# Patient Record
Sex: Male | Born: 1945 | Race: White | Hispanic: No | Smoking: Current every day smoker
Health system: Southern US, Community
[De-identification: ages and names within clinical notes are randomized; demographics above are authoritative.]

## PROBLEM LIST (undated history)

## (undated) DIAGNOSIS — K089 Disorder of teeth and supporting structures, unspecified: Secondary | ICD-10-CM

## (undated) DIAGNOSIS — Z972 Presence of dental prosthetic device (complete) (partial): Secondary | ICD-10-CM

## (undated) DIAGNOSIS — R350 Frequency of micturition: Secondary | ICD-10-CM

## (undated) DIAGNOSIS — J302 Other seasonal allergic rhinitis: Secondary | ICD-10-CM

## (undated) DIAGNOSIS — R339 Retention of urine, unspecified: Secondary | ICD-10-CM

## (undated) DIAGNOSIS — C61 Malignant neoplasm of prostate: Secondary | ICD-10-CM

## (undated) DIAGNOSIS — Z87442 Personal history of urinary calculi: Secondary | ICD-10-CM

## (undated) DIAGNOSIS — M1990 Unspecified osteoarthritis, unspecified site: Secondary | ICD-10-CM

## (undated) HISTORY — PX: PROSTATE BIOPSY: SHX241

## (undated) HISTORY — PX: OTHER SURGICAL HISTORY: SHX169

---

## 2017-09-10 DIAGNOSIS — C61 Malignant neoplasm of prostate: Secondary | ICD-10-CM

## 2017-09-10 HISTORY — PX: PROSTATE BIOPSY: SHX241

## 2017-09-10 HISTORY — DX: Malignant neoplasm of prostate: C61

## 2017-09-10 HISTORY — PX: HERNIA REPAIR: SHX51

## 2017-09-20 ENCOUNTER — Other Ambulatory Visit: Payer: Self-pay | Admitting: Urology

## 2017-09-20 DIAGNOSIS — C61 Malignant neoplasm of prostate: Secondary | ICD-10-CM

## 2017-10-07 ENCOUNTER — Encounter (HOSPITAL_COMMUNITY)
Admission: RE | Admit: 2017-10-07 | Discharge: 2017-10-07 | Disposition: A | Discharge status: Home / Self Care | Payer: Medicare Other | Source: Ambulatory Visit | Attending: Urology | Admitting: Urology

## 2017-10-07 ENCOUNTER — Ambulatory Visit (HOSPITAL_COMMUNITY)
Admission: RE | Admit: 2017-10-07 | Discharge: 2017-10-07 | Disposition: A | Discharge status: Home / Self Care | Payer: Medicare Other | Source: Ambulatory Visit | Attending: Urology | Admitting: Urology

## 2017-10-07 DIAGNOSIS — R937 Abnormal findings on diagnostic imaging of other parts of musculoskeletal system: Secondary | ICD-10-CM | POA: Diagnosis not present

## 2017-10-07 DIAGNOSIS — C61 Malignant neoplasm of prostate: Secondary | ICD-10-CM | POA: Insufficient documentation

## 2017-10-07 MED ORDER — TECHNETIUM TC 99M MEDRONATE IV KIT
22.0000 | PACK | Freq: Once | INTRAVENOUS | Status: AC | PRN
  Administered 2017-10-07: 22 via INTRAVENOUS

## 2017-10-11 ENCOUNTER — Other Ambulatory Visit: Payer: Self-pay | Admitting: Urology

## 2017-10-11 ENCOUNTER — Encounter: Payer: Self-pay | Admitting: Radiation Oncology

## 2017-10-11 DIAGNOSIS — C61 Malignant neoplasm of prostate: Secondary | ICD-10-CM

## 2017-10-17 ENCOUNTER — Ambulatory Visit (HOSPITAL_COMMUNITY)
Admission: RE | Admit: 2017-10-17 | Discharge: 2017-10-17 | Disposition: A | Discharge status: Home / Self Care | Payer: Medicare Other | Source: Ambulatory Visit | Attending: Urology | Admitting: Urology

## 2017-10-17 DIAGNOSIS — M48061 Spinal stenosis, lumbar region without neurogenic claudication: Secondary | ICD-10-CM | POA: Diagnosis not present

## 2017-10-17 DIAGNOSIS — C61 Malignant neoplasm of prostate: Secondary | ICD-10-CM

## 2017-10-17 DIAGNOSIS — M47814 Spondylosis without myelopathy or radiculopathy, thoracic region: Secondary | ICD-10-CM | POA: Insufficient documentation

## 2017-10-17 DIAGNOSIS — M47816 Spondylosis without myelopathy or radiculopathy, lumbar region: Secondary | ICD-10-CM | POA: Diagnosis not present

## 2017-10-17 MED ORDER — GADOBENATE DIMEGLUMINE 529 MG/ML IV SOLN
15.0000 mL | Freq: Once | INTRAVENOUS | Status: AC | PRN
  Administered 2017-10-17: 14 mL via INTRAVENOUS

## 2017-10-18 LAB — POCT I-STAT CREATININE: CREATININE: 0.8 mg/dL (ref 0.61–1.24)

## 2017-10-30 ENCOUNTER — Ambulatory Visit: Payer: Medicare Other

## 2017-10-30 ENCOUNTER — Ambulatory Visit: Payer: Medicare Other | Admitting: Radiation Oncology

## 2017-10-31 ENCOUNTER — Other Ambulatory Visit: Payer: Self-pay

## 2017-10-31 ENCOUNTER — Encounter: Payer: Self-pay | Admitting: Medical Oncology

## 2017-10-31 ENCOUNTER — Ambulatory Visit
Admission: RE | Admit: 2017-10-31 | Discharge: 2017-10-31 | Disposition: A | Discharge status: Home / Self Care | Payer: Medicare Other | Source: Ambulatory Visit | Attending: Radiation Oncology | Admitting: Radiation Oncology

## 2017-10-31 ENCOUNTER — Encounter: Payer: Self-pay | Admitting: Radiation Oncology

## 2017-10-31 DIAGNOSIS — Z800 Family history of malignant neoplasm of digestive organs: Secondary | ICD-10-CM | POA: Diagnosis not present

## 2017-10-31 DIAGNOSIS — N281 Cyst of kidney, acquired: Secondary | ICD-10-CM | POA: Diagnosis not present

## 2017-10-31 DIAGNOSIS — F17210 Nicotine dependence, cigarettes, uncomplicated: Secondary | ICD-10-CM | POA: Insufficient documentation

## 2017-10-31 DIAGNOSIS — M47894 Other spondylosis, thoracic region: Secondary | ICD-10-CM | POA: Insufficient documentation

## 2017-10-31 DIAGNOSIS — C61 Malignant neoplasm of prostate: Secondary | ICD-10-CM

## 2017-10-31 DIAGNOSIS — Z79899 Other long term (current) drug therapy: Secondary | ICD-10-CM | POA: Insufficient documentation

## 2017-10-31 DIAGNOSIS — Z8042 Family history of malignant neoplasm of prostate: Secondary | ICD-10-CM | POA: Insufficient documentation

## 2017-10-31 DIAGNOSIS — M5126 Other intervertebral disc displacement, lumbar region: Secondary | ICD-10-CM | POA: Diagnosis not present

## 2017-10-31 HISTORY — DX: Malignant neoplasm of prostate: C61

## 2017-10-31 NOTE — Progress Notes (Signed)
Introduced myself to Mr. Peter Arnold and his family as the prostate nurse navigator and my role. He states he has been in excellent health but had issues with urgency, nocturia and feeling like he was not emptying his bladder. He was prescribed Flomax and symptoms have greatly improved. He had an elevated PSA, underwent a biopsy and found out he has cancer. He received Mills Koller 10/11/17 and is scheduled for Lupron 11/12/17.  He states he had fatigue a few days post injection but it has improved. We discussed the side effects of ADT.  He is here to discuss his radiation options and would like to get treatment in [place] if possible. I informed him that Dr. Tammi Klippel works closely with Dr. Orlene Erm  in [place] so this should not be a problem. I gave him my business card and asked him to call if I can assist him in any way. He voiced understanding.

## 2017-10-31 NOTE — Progress Notes (Addendum)
GU Location of Tumor / Histology: prostatic adenocarcinoma  If Prostate Cancer, Gleason Score is (4 + 4) and PSA is (242). Prostate volume: 79 cm^3  Peter Arnold went to his PCP, Julieta Bellini, in December for his flu and pneumonia shot. He told Dr. Gloriann Loan about the urgency, hesitancy, and nocturia he was having. Dr. Gloriann Loan prescribed him Flomax and referred him to Dr. Comer Locket. He was diagnosed with prostate cancer 08/31/2017 by Dr. Comer Locket. He underwent a prostate biopsy on 08/31/2017. Then, patient phoned [facility] and obtained an appointment for a second opinion.   Biopsies of prostate (if applicable) revealed:    Past/Anticipated interventions by urology, if any: prostate biopsy, flomax, CT scan (pelvic lymphadenopathy and possible left lateral wall bladder tumor), Bone scan (two areas in thoracic spine), MRI of lumbar and thoracic spine (no mets), Firmagon 240 on 10/11/2017, scheduled to return in March 5th for Lupron, referral to radiation oncology  Past/Anticipated interventions by medical oncology, if any: no  Weight changes, if any: no  Bowel/Bladder complaints, if any: reports significant improvement in his urgency, hesitancy, weak stream and nocturia since starting Flomax. Denies dysuria or hematuria. Reports post void dribble has greatly improved since using Flomax as well.   Nausea/Vomiting, if any: no  Pain issues, if any:  no  SAFETY ISSUES:  Prior radiation? no  Pacemaker/ICD? no  Possible current pregnancy? no  Is the patient on methotrexate? no  Current Complaints / other details:  72 year old male. Divorced. Current smoker. Mechanic/farm equipment. Esophageal cancer - Father. Prostate cancer - Grandfather.  Requesting treatment in [place].  Accompanied today by friend, sister and niece.

## 2017-10-31 NOTE — Progress Notes (Signed)
See progress note under physician encounter. 

## 2017-10-31 NOTE — Progress Notes (Signed)
Radiation Oncology         [phone]) [phone] ________________________________  Initial Outpatient Consultation  Name: Peter Arnold MRN: [id number]  Date: 10/31/2017  DOB: May 16, 1946  VF:IEPP, Gwyndolyn Saxon, MD  McKenzie, Candee Furbish, MD   REFERRING PHYSICIAN: Alyson Ingles Candee Furbish, MD  DIAGNOSIS: 72 y.o. 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 Malignant neoplasm of prostate [facility]) [place] Ambulatory referral to Radiation Oncology    HISTORY OF PRESENT ILLNESS: Peter Arnold is a 72 y.o. male with a diagnosis of locally advanced prostate cancer. He was noted to have an elevated PSA of 242.80 in December 2018 by his primary care physician, Dr. Elisabeth Cara.  Accordingly, he was referred for evaluation in urology to Dr. Comer Locket and proceeded to transrectal ultrasound with 12 biopsies of the prostate on 08/31/2017.  The prostate volume measured 79 cc.  Out of 12 core biopsies, all 12 were positive.  The maximum Gleason score was 4+4, and this was seen in the left lateral mid, left lateral base, left lateral apex, right mid, right apex, left mid, right lateral base, right lateral apex, right lateral mid, and right base. Additionally there was Gleason 4+3 disease in the left base and 3+3 in the left apex.  Biopsies of prostate revealed:   The patient sought a second opinion with Dr. Alyson Ingles at [facility] on 09/20/2017 who started the workup for his high-risk prostate cancer including CT and bone scans, and he was started on Flomax for BPH with LUTS. He notes significant improvement in his urgency, hesitancy, weak stream, and nocturia with the Flomax. His CT scan on 10/07/2017 showed a 1.7 cm left iliac node, a 1.4 cm left obturator node and a 1.1 cm left inferior gluteal node, suspicious for metastatic prostate cancer.  Additionally, there was a 1.0 cm right external iliac node and asymetric enhancement of the left seminal vesicle.   The prostate gland was noted to indent the bladder base with a 2.6 x 1.0 cm enhancing mass along the left bladder base near the UVJ.  This was evaluated further with office cytoscopy on 10/10/2017 revealing an enlarged median lobe and severe hyperplasia with the prostate tumor extending into the left base of the bladder but no evidence of bladder tumor/involvement.  A Bone scan on 10/07/17 showed abnormal uptake in the cervical spine, mid thoracic spine at T8 and lumbar spine at L2 of unknown significance.  Further evaluation with MRI was recommended.  He received an injection of Firmagon 240mg  on 10/10/2017 and is scheduled to return to Dr. Alyson Ingles on March 5th for Lupron. An MRI of the thoracic and lumbar spine on 10/17/2017 showed degenerative changes only and was negative for metastatic disease.   The patient reviewed the biopsy results with his urologist and he has kindly been referred today for discussion of potential radiation treatment options. He is accompanied today by his sister, niece, and friend.   PREVIOUS RADIATION THERAPY: No  PAST MEDICAL HISTORY:  Past Medical History:  Diagnosis Date  . Prostate cancer ([place])       PAST SURGICAL HISTORY: Past Surgical History:  Procedure Laterality Date  . PROSTATE BIOPSY      FAMILY HISTORY:  Family History  Problem Relation Age of Onset  . Cancer Father        esophageal  . Cancer Paternal Grandfather        prostate    SOCIAL HISTORY:  Social History   Socioeconomic History  . Marital status: Divorced    Spouse name: Not on file  . Number of children: Not on file  . Years of education: Not on file  . Highest education level: Not on file  Social Needs  . Financial resource strain: Not on file  . Food insecurity - worry: Not on file  . Food insecurity - inability: Not on file  . Transportation needs - medical: Not on file  . Transportation needs - non-medical: Not on file  Occupational History  . Occupation: Dealer     Comment: works on farm equippment  Tobacco Use  . Smoking status: Current Every Day Smoker    Packs/day: 1.00    Years: 30.00    Pack years: 30.00    Types: Cigarettes  . Smokeless tobacco: Never Used  Substance and Sexual Activity  . Alcohol use: No    Frequency: Never  . Drug use: No  . Sexual activity: Yes  Other Topics Concern  . Not on file  Social History Narrative   Resides in [place], [place].    ALLERGIES: Patient has no known allergies.  MEDICATIONS:  Current Outpatient Medications  Medication Sig Dispense Refill  . fluticasone (FLONASE) 50 MCG/ACT nasal spray     . Multiple Vitamin (MULTIVITAMIN) tablet Take 1 tablet by mouth daily.    . tamsulosin (FLOMAX) 0.4 MG CAPS capsule     . finasteride (PROSCAR) 5 MG tablet      No current facility-administered medications for this encounter.     REVIEW OF SYSTEMS:  On review of systems, the patient reports that he is doing well overall. He denies any chest pain, shortness of breath, cough, fevers, chills, night sweats, or unintended weight changes. He denies any bowel disturbances, and denies abdominal pain, nausea or vomiting. He denies any new musculoskeletal or joint aches or pains. His IPSS was 14, indicating moderate urinary symptoms. He reports significant improvement in his urgency, hesitancy, weak stream, nocturia, and post-void dribble since starting Flomax. He denies any dysuria or hematuria. He reports mild-moderate erectile dysfunction. A complete review of systems is obtained and is otherwise negative.    PHYSICAL EXAM:  Wt Readings from Last 3 Encounters:  10/31/17 153 lb (69.4 kg)   Temp Readings from Last 3 Encounters:  10/31/17 98.2 F (36.8 C) (Oral)   BP Readings from Last 3 Encounters:  10/31/17 124/77   Pulse Readings from Last 3 Encounters:  10/31/17 83   Pain Assessment Pain Score: 0-No pain/10  In general this is a well appearing Caucasian man in no acute distress. He is alert and oriented  x4 and appropriate throughout the examination. HEENT reveals that the patient is normocephalic, atraumatic. Skin is intact without any evidence of gross lesions. Cardiovascular exam reveals a regular rate and rhythm, no clicks rubs or murmurs are auscultated. Chest is clear to auscultation bilaterally. Lymphatic assessment is performed and does not reveal any adenopathy in the supraclavicular or axillary chains. Abdomen has active bowel sounds in all quadrants and is intact. The abdomen is soft, non tender, non distended. Lower extremities are negative for pretibial pitting edema, deep calf tenderness, cyanosis or clubbing.   KPS = 100  100 - Normal; no complaints; no evidence of disease. 90   - Able to carry on normal activity; minor signs or symptoms of disease. 80   - Normal activity with effort; some signs or symptoms of disease. 66   - Cares for self; unable to  carry on normal activity or to do active work. 60   - Requires occasional assistance, but is able to care for most of his personal needs. 50   - Requires considerable assistance and frequent medical care. 22   - Disabled; requires special care and assistance. 32   - Severely disabled; hospital admission is indicated although death not imminent. 69   - Very sick; hospital admission necessary; active supportive treatment necessary. 10   - Moribund; fatal processes progressing rapidly. 0     - Dead  Karnofsky DA, Abelmann WH, Craver LS and Burchenal JH [phone]) The use of the nitrogen mustards in the palliative treatment of carcinoma: with particular reference to bronchogenic carcinoma Cancer 1 634-56  LABORATORY DATA:  No results found for: WBC, HGB, HCT, MCV, PLT No results found for: NA, K, CL, CO2 No results found for: ALT, AST, GGT, ALKPHOS, BILITOT   RADIOGRAPHY: Mr Thoracic Spine W Wo Contrast  Result Date: 10/17/2017 CLINICAL DATA:  History of prostate cancer. Abnormal uptake on recent bone scan could be due to metastatic  disease or degenerative change. EXAM: MRI THORACIC AND LUMBAR SPINE WITHOUT AND WITH CONTRAST TECHNIQUE: Multiplanar and multiecho pulse sequences of the thoracic and lumbar spine were obtained without and with intravenous contrast. CONTRAST:  14 ml MULTIHANCE GADOBENATE DIMEGLUMINE 529 MG/ML IV SOLN COMPARISON:  MRI lumbar spine 07/20/2014. Whole-body bone scan 10/07/2017. CT abdomen and pelvis 10/07/2017. FINDINGS: MRI THORACIC SPINE FINDINGS Alignment:  Maintained. Vertebrae: No fracture. No marrow signal abnormality to suggest metastatic disease or other neoplastic process. Cord:  Normal signal throughout. Paraspinal and other soft tissues: Negative. Disc levels: C6-7 is partially imaged. There is a large disc osteophyte complex eccentric to the right and uncovertebral spurring. The right foramen is markedly narrowed. T3-4: Minimal disc bulge without stenosis. T5-6: Small left paracentral protrusion without stenosis. T6-7: Shallow disc bulge and facet degenerative disease without stenosis. T7-8: Shallow left paracentral protrusion and facet arthropathy without stenosis. T8-9: Minimal disc bulge and facet degenerative disease without stenosis. T11-12: Facet arthropathy, minimal disc bulge and ligamentum flavum thickening without stenosis. T12-L1: Very shallow disc bulge without stenosis. Except as described, intervertebral disc spaces are unremarkable. MRI LUMBAR SPINE FINDINGS Segmentation:  Standard. Alignment:  0.2 cm degenerative retrolisthesis L2 on L3 is noted. Vertebrae: Negative for metastatic disease. Scattered degenerative endplate signal change is most notable at L1-2. Conus medullaris: Extends to the L1 level and appears normal. Paraspinal and other soft tissues: Renal cysts are noted. Disc levels: L1-2: There has been progression of degenerative disc disease at this level. Broad-based disc bulge is more prominent and there is new protruding disc in the left subarticular recess and foramen. Disc could  impact the descending left L2 root. There is moderate left foraminal narrowing. The right foramen is open. L2-3: Broad-based disc bulge and facet degenerative disease are stable in appearance. Mild narrowing in the subarticular recesses is unchanged. The foramina are open. L3-4: There is a disc bulge and superimposed right foraminal protrusion. The central canal is open. Mild to moderate foraminal narrowing is worse on the right. The appearance is unchanged. L4-5: Very shallow left paracentral protrusion superimposed on a mild bulge is unchanged. Loss of disc space height is again seen. The central canal is open. Mild to moderate bilateral foraminal narrowing is unchanged. L5-S1: Negative. IMPRESSION: Negative for metastatic disease in the thoracic or lumbar spine. Uptake on bone scan was due to degenerative change. Mild thoracic spondylosis without central canal or foraminal narrowing. Spondylosis at  L1-2 has worsened since the prior MRI and causes mild narrowing in the left subarticular recess which could impact the descending left L2 root. Moderate left foraminal narrowing is also present at this level. The appearance of the lumbar spine is otherwise unchanged. Electronically Signed   By: Inge Rise M.D.   On: 10/17/2017 12:14   Mr Lumbar Spine W Wo Contrast  Result Date: 10/17/2017 CLINICAL DATA:  History of prostate cancer. Abnormal uptake on recent bone scan could be due to metastatic disease or degenerative change. EXAM: MRI THORACIC AND LUMBAR SPINE WITHOUT AND WITH CONTRAST TECHNIQUE: Multiplanar and multiecho pulse sequences of the thoracic and lumbar spine were obtained without and with intravenous contrast. CONTRAST:  14 ml MULTIHANCE GADOBENATE DIMEGLUMINE 529 MG/ML IV SOLN COMPARISON:  MRI lumbar spine 07/20/2014. Whole-body bone scan 10/07/2017. CT abdomen and pelvis 10/07/2017. FINDINGS: MRI THORACIC SPINE FINDINGS Alignment:  Maintained. Vertebrae: No fracture. No marrow signal abnormality  to suggest metastatic disease or other neoplastic process. Cord:  Normal signal throughout. Paraspinal and other soft tissues: Negative. Disc levels: C6-7 is partially imaged. There is a large disc osteophyte complex eccentric to the right and uncovertebral spurring. The right foramen is markedly narrowed. T3-4: Minimal disc bulge without stenosis. T5-6: Small left paracentral protrusion without stenosis. T6-7: Shallow disc bulge and facet degenerative disease without stenosis. T7-8: Shallow left paracentral protrusion and facet arthropathy without stenosis. T8-9: Minimal disc bulge and facet degenerative disease without stenosis. T11-12: Facet arthropathy, minimal disc bulge and ligamentum flavum thickening without stenosis. T12-L1: Very shallow disc bulge without stenosis. Except as described, intervertebral disc spaces are unremarkable. MRI LUMBAR SPINE FINDINGS Segmentation:  Standard. Alignment:  0.2 cm degenerative retrolisthesis L2 on L3 is noted. Vertebrae: Negative for metastatic disease. Scattered degenerative endplate signal change is most notable at L1-2. Conus medullaris: Extends to the L1 level and appears normal. Paraspinal and other soft tissues: Renal cysts are noted. Disc levels: L1-2: There has been progression of degenerative disc disease at this level. Broad-based disc bulge is more prominent and there is new protruding disc in the left subarticular recess and foramen. Disc could impact the descending left L2 root. There is moderate left foraminal narrowing. The right foramen is open. L2-3: Broad-based disc bulge and facet degenerative disease are stable in appearance. Mild narrowing in the subarticular recesses is unchanged. The foramina are open. L3-4: There is a disc bulge and superimposed right foraminal protrusion. The central canal is open. Mild to moderate foraminal narrowing is worse on the right. The appearance is unchanged. L4-5: Very shallow left paracentral protrusion superimposed on  a mild bulge is unchanged. Loss of disc space height is again seen. The central canal is open. Mild to moderate bilateral foraminal narrowing is unchanged. L5-S1: Negative. IMPRESSION: Negative for metastatic disease in the thoracic or lumbar spine. Uptake on bone scan was due to degenerative change. Mild thoracic spondylosis without central canal or foraminal narrowing. Spondylosis at L1-2 has worsened since the prior MRI and causes mild narrowing in the left subarticular recess which could impact the descending left L2 root. Moderate left foraminal narrowing is also present at this level. The appearance of the lumbar spine is otherwise unchanged. Electronically Signed   By: Inge Rise M.D.   On: 10/17/2017 12:14   Nm Bone Scan Whole Body  Result Date: 10/07/2017 CLINICAL DATA:  Prostate cancer, PSA = 242.8 EXAM: NUCLEAR MEDICINE WHOLE BODY BONE SCAN TECHNIQUE: Whole body anterior and posterior images were obtained approximately 3 hours after intravenous  injection of radiopharmaceutical. RADIOPHARMACEUTICALS:  22 mCi Technetium-26m MDP IV COMPARISON:  None Correlation: CT abdomen and pelvis 10/07/2017 FINDINGS: Multiple foci of abnormal osseous accumulation are identified including 3 foci laterally within the cervical spine, lumbar spine on LEFT at L2, and in the midthoracic spine at T8. The thoracic and lumbar foci could be degenerative or metastatic in origin. The foci of uptake in the cervical spine favor degenerative disease. Recent CT demonstrates multilevel degenerative disc disease changes of the lumbar spine without obvious sclerotic L2 metastasis. Uptake at the shoulders is likely degenerative. Uptake at the LEFT mandible and maxilla is likely related to dental disease. No other worrisome sites of abnormal osseous tracer accumulation are identified. Expected urinary tract and soft tissue distribution of tracer. IMPRESSION: Foci of abnormal increased tracer localization in the cervical spine,  midthoracic spine at T8, and lumbar spine at L2. Cannot exclude metastatic lesions at the RIGHT T8 or LEFT L2 sites of uptake on bone scan; consider MR to further assess. Electronically Signed   By: Lavonia Dana M.D.   On: 10/07/2017 17:36      IMPRESSION/PLAN: 1. 72 y.o. 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 We discussed the patient's workup and outlined the nature of prostate cancer in this setting. The patient's T stage, Gleason's score, and PSA put him into the high-risk risk group and recent imaging studies reveal locally advanced disease with pelvic lymph node involvement. Accordingly, he is eligible for treatment with 8 weeks of external radiation in combination with LT-ADT. We discussed the available radiation techniques, and focused on the details and logistics and delivery. The recommendation is for 8 weeks of daily radiotherapy treatments to the prostate and pelvic lymph nodes to begin after 2 months of ADT.  He was started on ADT 10/10/17.  We discussed and outlined the risks, benefits, short and long-term effects associated with radiotherapy and compared and contrasted these with prostatectomy. We also detailed the role of ADT in the treatment of high-risk prostate cancer and outlined the associated side effects that could be expected with this therapy.  At the end of the conversation the patient is interested in moving forward with radiation therapy in combination with LT-ADT. He has received a Firmagon injection on 10/10/2017 and is scheduled to receive his first Lupron injection on March 5th. He would like to pursue radiation therapy in [place], where he lives. We will contact Dr. Orlene Erm to make a referral for the patient to have his treatment closer to home.   We spent more than 50% of today's visit face to face with the patient in counseling and/or coordination of care.   Nicholos Johns, PA-C    Tyler Pita, MD    [facility] Direct Dial: [phone]  Fax: [phone] [email or web address]  Skype  [email or web address]  This document serves as a record of services personally performed by Tyler Pita, MD and Freeman Caldron, PA-C. It was created on their behalf by Rae Lips, a trained medical scribe. The creation of this record is based on the scribe's personal observations and the providers' statements to them. This document has been checked and approved by the attending providers.

## 2017-11-07 ENCOUNTER — Telehealth: Payer: Self-pay | Admitting: *Deleted

## 2017-11-07 NOTE — Telephone Encounter (Signed)
CALLED PATIENT TO INFORM OF APPT. WITH  DR. PALERMO ON 11-13-17 @ 9::30 AM, LVM FOR A RETURN CALL

## 2019-02-01 IMAGING — MR MR LUMBAR SPINE WO/W CM
6 of 16 series · 17 of 48 positions shown · IV contrast (multihance)
Comparison: MRI lumbar spine 07/20/2014. Whole-body bone scan
10/07/2017. CT abdomen and pelvis 10/07/2017.

CLINICAL DATA: History of prostate cancer. Abnormal uptake on
recent bone scan could be due to metastatic disease or degenerative
change.

EXAM:
MRI THORACIC AND LUMBAR SPINE WITHOUT AND WITH CONTRAST
TECHNIQUE: Multiplanar and multiecho pulse sequences of the thoracic and lumbar
spine were obtained without and with intravenous contrast.
CONTRAST:  14 ml MULTIHANCE GADOBENATE DIMEGLUMINE 529 MG/ML IV SOLN

[Series 5: T1 · sagittal · 3.0mm · 0.66mm/px · 1 of 15 slices shown (1 of 2)]
[im 1/15]
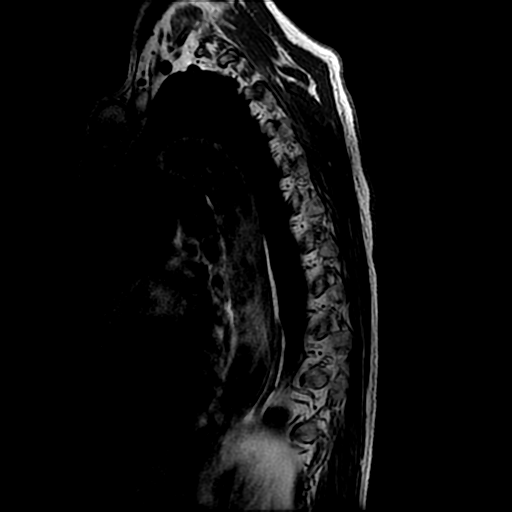

[Series 7: T2 · sagittal · 3.0mm · 0.66mm/px · 1 of 15 slices shown (1 of 3)]
[im 1/15]
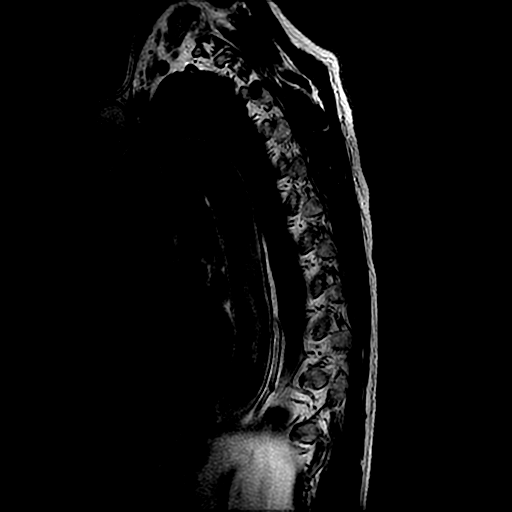

[Series 8: T2 · axial · 4.0mm · 0.39mm/px · z∈[-310,-58]mm · 4 of 40 slices shown (2 of 3)]
[im 1/40]
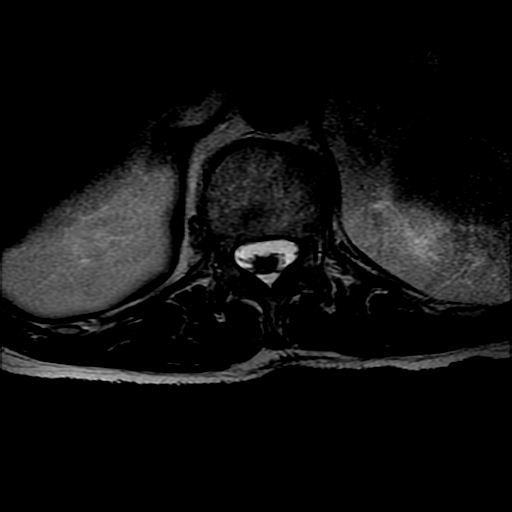
[im 14/40]
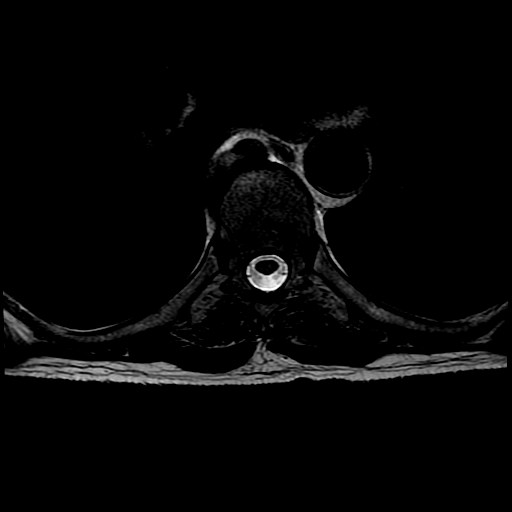
[im 27/40]
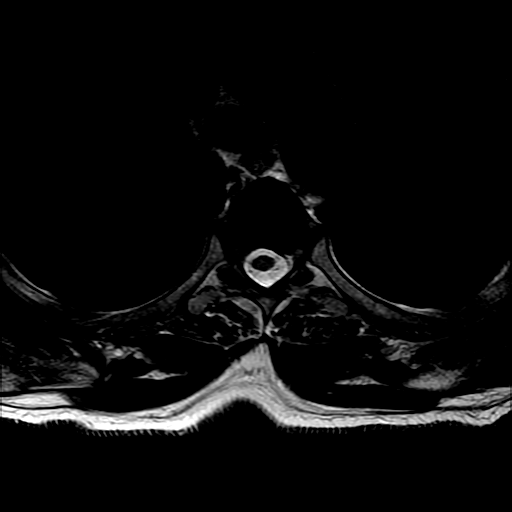
[im 40/40]
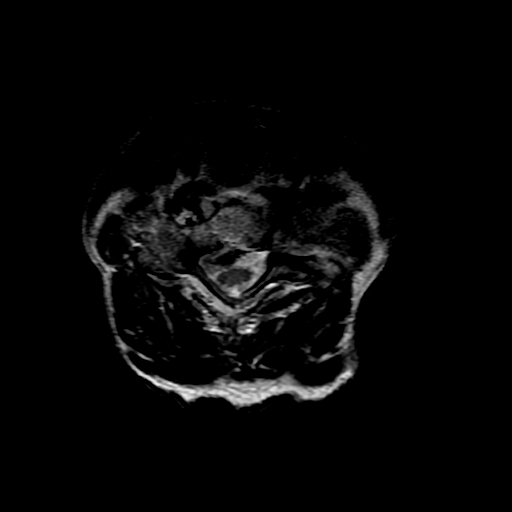

[Series 10: T1 · axial · non-contrast · 4.0mm · 0.39mm/px · z∈[-310,-122]mm · 4 of 44 slices shown (2 of 2)]
[im 1/44]
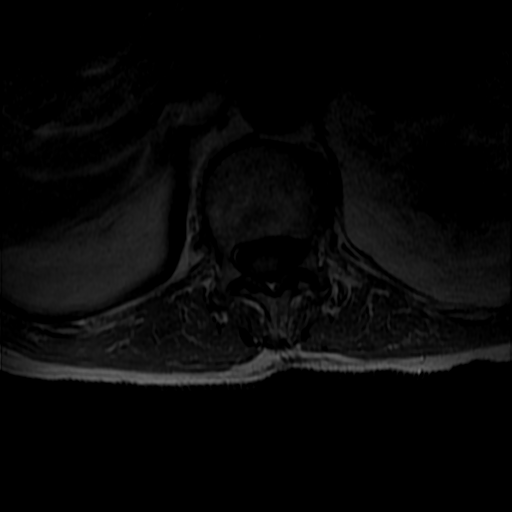
[im 11/44]
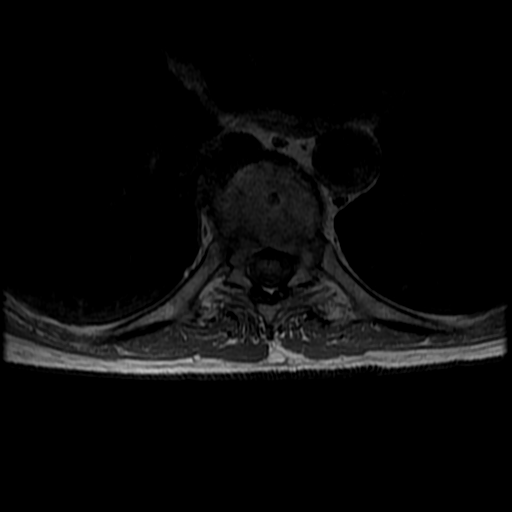
[im 22/44]
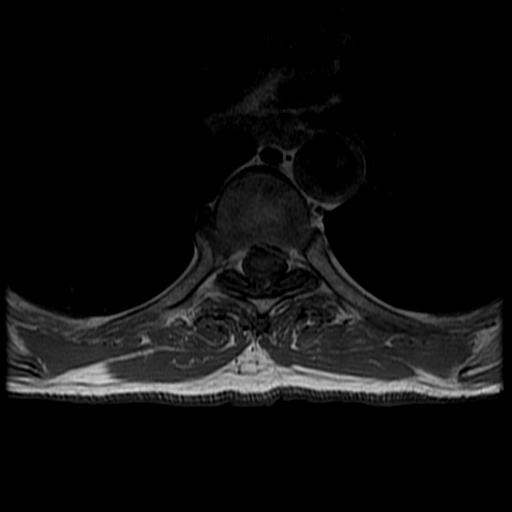
[im 33/44]
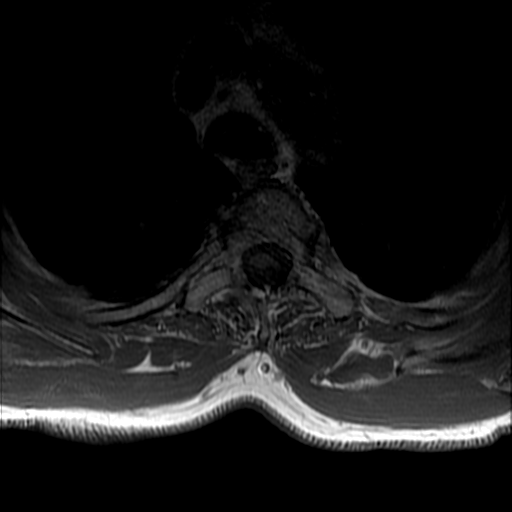

[Series 15: T2 · axial · 4.0mm · 0.39mm/px · z∈[-541,-320]mm · 5 of 40 slices shown (3 of 3)]
[im 1/40]
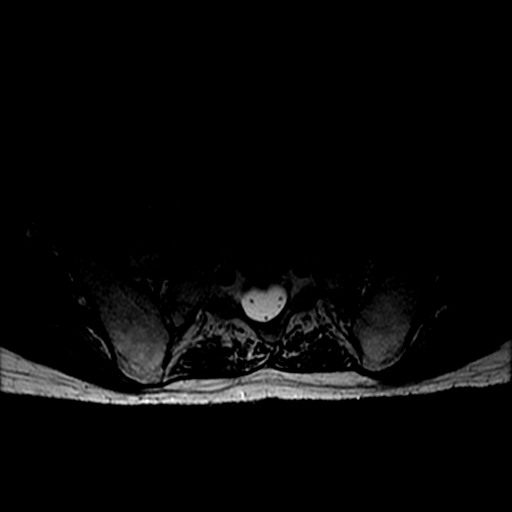
[im 10/40]
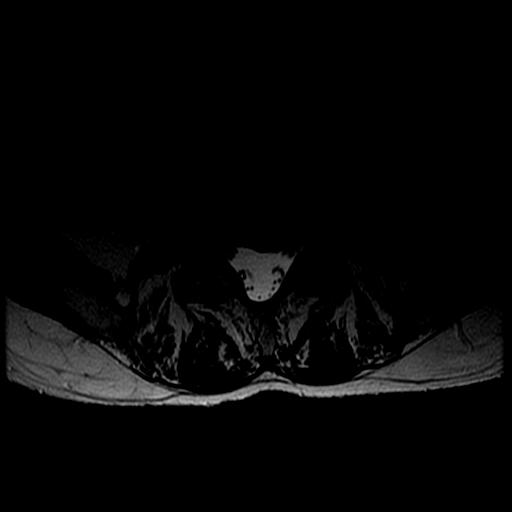
[im 20/40]
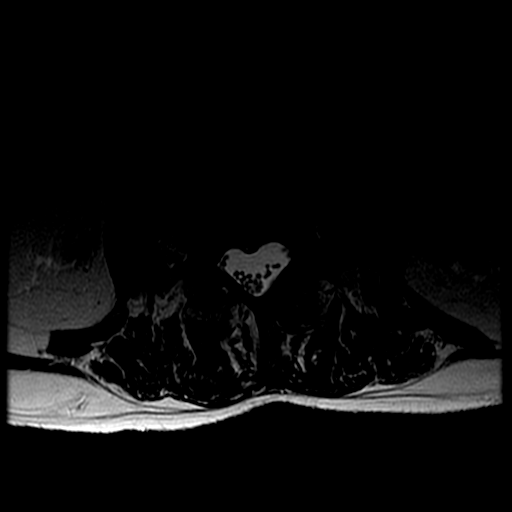
[im 30/40]
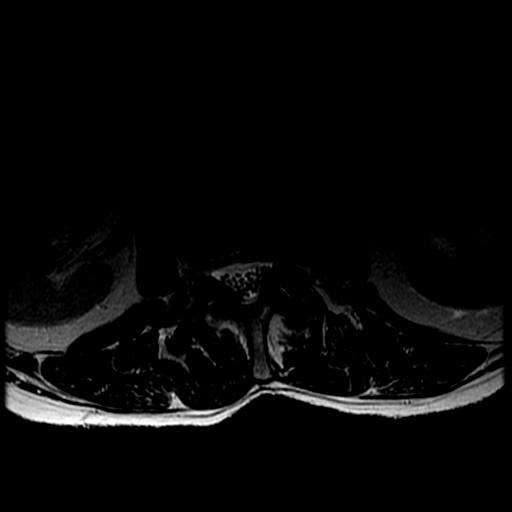
[im 40/40]
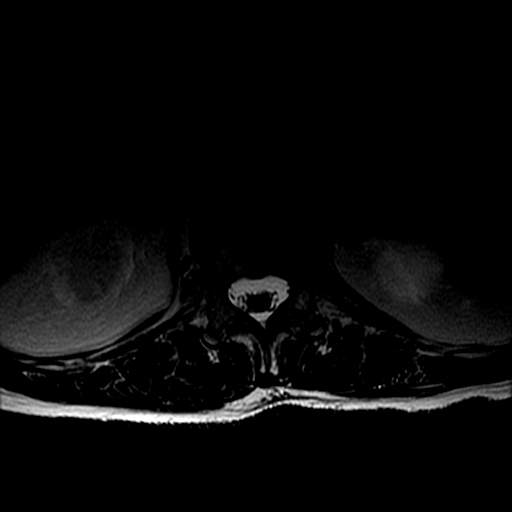

[Series 17: T2 post-contrast · sagittal · 4.0mm · 0.51mm/px · 2 of 14 slices shown]
[im 1/14]
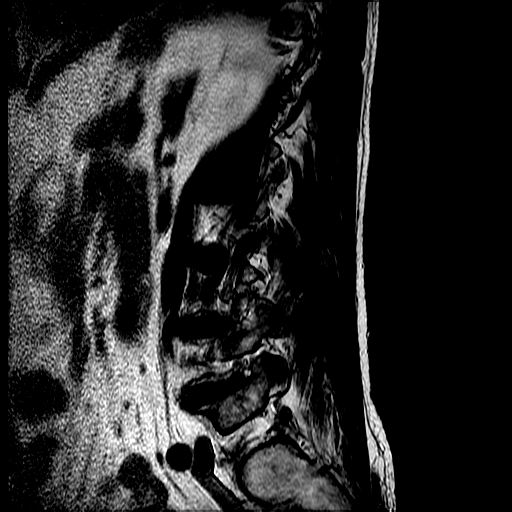
[im 14/14]
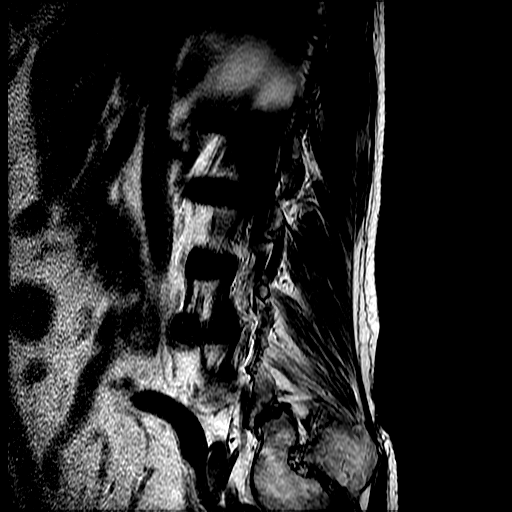

[17 of 48 positions shown; findings below may reference images not displayed]

FINDINGS: MRI THORACIC SPINE FINDINGS

Alignment:  Maintained.

Vertebrae: No fracture. No marrow signal abnormality to suggest
metastatic disease or other neoplastic process.

Cord:  Normal signal throughout.

Paraspinal and other soft tissues: Negative.

Disc levels:

C6-7 is partially imaged. There is a large disc osteophyte complex
eccentric to the right and uncovertebral spurring. The right foramen
is markedly narrowed.

T3-4: Minimal disc bulge without stenosis.

T5-6: Small left paracentral protrusion without stenosis.

T6-7: Shallow disc bulge and facet degenerative disease without
stenosis.

T7-8: Shallow left paracentral protrusion and facet arthropathy
without stenosis.

T8-9: Minimal disc bulge and facet degenerative disease without
stenosis.

T11-12: Facet arthropathy, minimal disc bulge and ligamentum flavum
thickening without stenosis.

T12-L1: Very shallow disc bulge without stenosis.

Except as described, intervertebral disc spaces are unremarkable.

MRI LUMBAR SPINE FINDINGS

Segmentation:  Standard.

Alignment:  0.2 cm degenerative retrolisthesis L2 on L3 is noted.

Vertebrae: Negative for metastatic disease. Scattered degenerative
endplate signal change is most notable at L1-2.

Conus medullaris: Extends to the L1 level and appears normal.

Paraspinal and other soft tissues: Renal cysts are noted.

Disc levels:

L1-2: There has been progression of degenerative disc disease at
this level. Broad-based disc bulge is more prominent and there is
new protruding disc in the left subarticular recess and foramen.
Disc could impact the descending left L2 root. There is moderate
left foraminal narrowing. The right foramen is open.

L2-3: Broad-based disc bulge and facet degenerative disease are
stable in appearance. Mild narrowing in the subarticular recesses is
unchanged. The foramina are open.

L3-4: There is a disc bulge and superimposed right foraminal
protrusion. The central canal is open. Mild to moderate foraminal
narrowing is worse on the right. The appearance is unchanged.

L4-5: Very shallow left paracentral protrusion superimposed on a
mild bulge is unchanged. Loss of disc space height is again seen.
The central canal is open. Mild to moderate bilateral foraminal
narrowing is unchanged.

L5-S1: Negative.
IMPRESSION: Negative for metastatic disease in the thoracic or lumbar spine.
Uptake on bone scan was due to degenerative change.

Mild thoracic spondylosis without central canal or foraminal
narrowing.

Spondylosis at L1-2 has worsened since the prior MRI and causes mild
narrowing in the left subarticular recess which could impact the
descending left L2 root. Moderate left foraminal narrowing is also
present at this level. The appearance of the lumbar spine is
otherwise unchanged.

## 2021-01-10 ENCOUNTER — Encounter (HOSPITAL_COMMUNITY): Payer: Self-pay | Admitting: Urology

## 2021-01-10 ENCOUNTER — Encounter (HOSPITAL_COMMUNITY)
Admission: RE | Disposition: A | Discharge status: Home / Self Care | Payer: Self-pay | Source: Ambulatory Visit | Attending: Urology

## 2021-01-10 ENCOUNTER — Ambulatory Visit (HOSPITAL_COMMUNITY)
Admission: RE | Admit: 2021-01-10 | Discharge: 2021-01-10 | Disposition: A | Discharge status: Home / Self Care | Payer: Medicare Other | Source: Ambulatory Visit | Attending: Urology | Admitting: Urology

## 2021-01-10 ENCOUNTER — Other Ambulatory Visit: Payer: Self-pay

## 2021-01-10 ENCOUNTER — Inpatient Hospital Stay (HOSPITAL_COMMUNITY): Payer: Medicare Other

## 2021-01-10 ENCOUNTER — Other Ambulatory Visit: Payer: Self-pay | Admitting: Urology

## 2021-01-10 ENCOUNTER — Inpatient Hospital Stay (HOSPITAL_COMMUNITY): Payer: Medicare Other | Admitting: Certified Registered Nurse Anesthetist

## 2021-01-10 DIAGNOSIS — Z8546 Personal history of malignant neoplasm of prostate: Secondary | ICD-10-CM | POA: Insufficient documentation

## 2021-01-10 DIAGNOSIS — Z79899 Other long term (current) drug therapy: Secondary | ICD-10-CM | POA: Diagnosis not present

## 2021-01-10 DIAGNOSIS — Z87442 Personal history of urinary calculi: Secondary | ICD-10-CM | POA: Insufficient documentation

## 2021-01-10 DIAGNOSIS — Z20822 Contact with and (suspected) exposure to covid-19: Secondary | ICD-10-CM | POA: Diagnosis not present

## 2021-01-10 DIAGNOSIS — F17200 Nicotine dependence, unspecified, uncomplicated: Secondary | ICD-10-CM | POA: Diagnosis not present

## 2021-01-10 DIAGNOSIS — N132 Hydronephrosis with renal and ureteral calculous obstruction: Secondary | ICD-10-CM | POA: Diagnosis present

## 2021-01-10 HISTORY — PX: CYSTOSCOPY WITH STENT PLACEMENT: SHX5790

## 2021-01-10 LAB — SARS CORONAVIRUS 2 BY RT PCR (HOSPITAL ORDER, PERFORMED IN ~~LOC~~ HOSPITAL LAB): SARS Coronavirus 2: NEGATIVE

## 2021-01-10 SURGERY — CYSTOSCOPY, WITH STENT INSERTION
Anesthesia: General | Laterality: Bilateral

## 2021-01-10 MED ORDER — LIDOCAINE HCL (CARDIAC) PF 100 MG/5ML IV SOSY
PREFILLED_SYRINGE | INTRAVENOUS | Status: DC | PRN
  Administered 2021-01-10: 60 mg via INTRAVENOUS
  Administered 2021-01-10: 40 mg via INTRAVENOUS

## 2021-01-10 MED ORDER — LIDOCAINE 2% (20 MG/ML) 5 ML SYRINGE
INTRAMUSCULAR | Status: AC
  Filled 2021-01-10: qty 5

## 2021-01-10 MED ORDER — DEXAMETHASONE SODIUM PHOSPHATE 10 MG/ML IJ SOLN
INTRAMUSCULAR | Status: AC
  Filled 2021-01-10: qty 1

## 2021-01-10 MED ORDER — PHENYLEPHRINE 40 MCG/ML (10ML) SYRINGE FOR IV PUSH (FOR BLOOD PRESSURE SUPPORT)
PREFILLED_SYRINGE | INTRAVENOUS | Status: AC
  Filled 2021-01-10: qty 10

## 2021-01-10 MED ORDER — FENTANYL CITRATE (PF) 100 MCG/2ML IJ SOLN
INTRAMUSCULAR | Status: DC | PRN
  Administered 2021-01-10 (×3): 50 ug via INTRAVENOUS

## 2021-01-10 MED ORDER — OXYCODONE HCL 5 MG PO TABS: 5.0000 mg | ORAL_TABLET | Freq: Once | ORAL | Status: DC | PRN

## 2021-01-10 MED ORDER — CHLORHEXIDINE GLUCONATE 0.12 % MT SOLN
15.0000 mL | OROMUCOSAL | Status: AC
  Administered 2021-01-10: 15 mL via OROMUCOSAL

## 2021-01-10 MED ORDER — GLYCOPYRROLATE 0.2 MG/ML IJ SOLN
INTRAMUSCULAR | Status: DC | PRN
  Administered 2021-01-10: .2 mg via INTRAVENOUS

## 2021-01-10 MED ORDER — ONDANSETRON HCL 4 MG/2ML IJ SOLN
INTRAMUSCULAR | Status: DC | PRN
  Administered 2021-01-10: 4 mg via INTRAVENOUS

## 2021-01-10 MED ORDER — CIPROFLOXACIN HCL 500 MG PO TABS: 500.0000 mg | ORAL_TABLET | Freq: Two times a day (BID) | ORAL | 0 refills | Status: AC

## 2021-01-10 MED ORDER — LACTATED RINGERS IV SOLN: INTRAVENOUS | Status: DC

## 2021-01-10 MED ORDER — AMISULPRIDE (ANTIEMETIC) 5 MG/2ML IV SOLN: 10.0000 mg | Freq: Once | INTRAVENOUS | Status: DC | PRN

## 2021-01-10 MED ORDER — EPHEDRINE 5 MG/ML INJ
INTRAVENOUS | Status: AC
  Filled 2021-01-10: qty 10

## 2021-01-10 MED ORDER — DEXAMETHASONE SODIUM PHOSPHATE 10 MG/ML IJ SOLN
INTRAMUSCULAR | Status: DC | PRN
  Administered 2021-01-10: 4 mg via INTRAVENOUS

## 2021-01-10 MED ORDER — TRAMADOL HCL 50 MG PO TABS: 50.0000 mg | ORAL_TABLET | Freq: Four times a day (QID) | ORAL | 0 refills | Status: AC | PRN

## 2021-01-10 MED ORDER — GLYCOPYRROLATE PF 0.2 MG/ML IJ SOSY
PREFILLED_SYRINGE | INTRAMUSCULAR | Status: AC
  Filled 2021-01-10: qty 1

## 2021-01-10 MED ORDER — PROMETHAZINE HCL 25 MG/ML IJ SOLN: 6.2500 mg | INTRAMUSCULAR | Status: DC | PRN

## 2021-01-10 MED ORDER — PROPOFOL 10 MG/ML IV BOLUS
INTRAVENOUS | Status: AC
  Filled 2021-01-10: qty 20

## 2021-01-10 MED ORDER — CIPROFLOXACIN IN D5W 400 MG/200ML IV SOLN
400.0000 mg | Freq: Once | INTRAVENOUS | Status: AC
  Administered 2021-01-10: 400 mg via INTRAVENOUS
  Filled 2021-01-10: qty 200

## 2021-01-10 MED ORDER — OXYCODONE HCL 5 MG/5ML PO SOLN
5.0000 mg | Freq: Once | ORAL | Status: DC | PRN
Start: 2021-01-10 — End: 2021-01-10

## 2021-01-10 MED ORDER — FENTANYL CITRATE (PF) 250 MCG/5ML IJ SOLN
INTRAMUSCULAR | Status: AC
  Filled 2021-01-10: qty 5

## 2021-01-10 MED ORDER — MIDAZOLAM HCL 5 MG/5ML IJ SOLN
INTRAMUSCULAR | Status: DC | PRN
  Administered 2021-01-10: 1 mg via INTRAVENOUS

## 2021-01-10 MED ORDER — PROPOFOL 10 MG/ML IV BOLUS
INTRAVENOUS | Status: DC | PRN
  Administered 2021-01-10: 200 mg via INTRAVENOUS

## 2021-01-10 MED ORDER — HYDROMORPHONE HCL 1 MG/ML IJ SOLN: 0.2500 mg | INTRAMUSCULAR | Status: DC | PRN

## 2021-01-10 MED ORDER — IOHEXOL 300 MG/ML  SOLN
INTRAMUSCULAR | Status: DC | PRN
  Administered 2021-01-10: 25 mL via URETHRAL

## 2021-01-10 MED ORDER — SODIUM CHLORIDE 0.9 % IR SOLN
Status: DC | PRN
  Administered 2021-01-10: 3000 mL

## 2021-01-10 MED ORDER — MIDAZOLAM HCL 2 MG/2ML IJ SOLN
INTRAMUSCULAR | Status: AC
  Filled 2021-01-10: qty 2

## 2021-01-10 MED ORDER — ONDANSETRON HCL 4 MG/2ML IJ SOLN
INTRAMUSCULAR | Status: AC
  Filled 2021-01-10: qty 2

## 2021-01-10 MED ORDER — PHENAZOPYRIDINE HCL 200 MG PO TABS: 200.0000 mg | ORAL_TABLET | Freq: Three times a day (TID) | ORAL | 0 refills | Status: DC | PRN

## 2021-01-10 SURGICAL SUPPLY — 12 items
BAG URO CATCHER STRL LF (MISCELLANEOUS) ×2 IMPLANT
CATH URET 5FR 28IN OPEN ENDED (CATHETERS) ×2 IMPLANT
CLOTH BEACON ORANGE TIMEOUT ST (SAFETY) ×2 IMPLANT
GLOVE SURG ENC TEXT LTX SZ7.5 (GLOVE) ×2 IMPLANT
GOWN STRL REUS W/TWL XL LVL3 (GOWN DISPOSABLE) ×2 IMPLANT
GUIDEWIRE STR DUAL SENSOR (WIRE) IMPLANT
GUIDEWIRE ZIPWRE .038 STRAIGHT (WIRE) ×2 IMPLANT
KIT TURNOVER KIT A (KITS) ×2 IMPLANT
MANIFOLD NEPTUNE II (INSTRUMENTS) ×2 IMPLANT
PACK CYSTO (CUSTOM PROCEDURE TRAY) ×2 IMPLANT
STENT URET 6FRX24 CONTOUR (STENTS) ×4 IMPLANT
TUBING CONNECTING 10 (TUBING) ×2 IMPLANT

## 2021-01-10 NOTE — Anesthesia Postprocedure Evaluation (Signed)
Anesthesia Post Note  Patient: Peter Arnold  Procedure(s) Performed: CYSTOSCOPY WITH STENT PLACEMENT (Bilateral )     Patient location during evaluation: PACU Anesthesia Type: General Level of consciousness: awake and alert Pain management: pain level controlled Vital Signs Assessment: post-procedure vital signs reviewed and stable Respiratory status: spontaneous breathing, nonlabored ventilation and respiratory function stable Cardiovascular status: blood pressure returned to baseline and stable Postop Assessment: no apparent nausea or vomiting Anesthetic complications: no   No complications documented.  Last Vitals:  Vitals:   01/10/21 1730 01/10/21 1751  BP: (!) 167/96 (!) 156/98  Pulse: 73 72  Resp: 15 15  Temp:  36.9 C  SpO2: 96% 98%    Last Pain:  Vitals:   01/10/21 1751  TempSrc:   PainSc: 0-No pain                 Lynda Rainwater

## 2021-01-10 NOTE — Anesthesia Procedure Notes (Signed)
Procedure Name: LMA Insertion Date/Time: 01/10/2021 4:22 PM Performed by: Adalberto Ill, CRNA Pre-anesthesia Checklist: Patient identified, Emergency Drugs available, Suction available, Patient being monitored and Timeout performed Patient Re-evaluated:Patient Re-evaluated prior to induction Oxygen Delivery Method: Circle system utilized Preoxygenation: Pre-oxygenation with 100% oxygen Induction Type: IV induction Ventilation: Mask ventilation without difficulty LMA: LMA inserted LMA Size: 4.0 Number of attempts: 1 (brief atraumatic ) Placement Confirmation: positive ETCO2 and breath sounds checked- equal and bilateral ETT to lip (cm): yes. Tube secured with: Tape Dental Injury: Teeth and Oropharynx as per pre-operative assessment

## 2021-01-10 NOTE — Op Note (Signed)
Operative Note  Preoperative diagnosis:  1.  Bilateral obstructing ureteral stones  Postoperative diagnosis: 1.  Bilateral obstructing ureteral stones  Procedure(s): 1.  Cystoscopy with bilateral ureteral stent placement 2.  Bilateral retrograde pyelograms with intraoperative interpretation of fluoroscopic imaging  Surgeon: Ellison Hughs, MD  Assistants:  None  Anesthesia:  General  Complications:  None  EBL: Less than 5 mL  Specimens: 1.  None  Drains/Catheters: 1.  Bilateral 6 French, 24 cm JJ stents without tethers  Intraoperative findings:   1. Left retrograde pyelogram revealed a filling defect within the proximal aspects of the left ureter, consistent with the left UPJ calculus noted on recent cross-sectional imaging.  The renal pelvis was uniformly dilated with no additional filling defects. 2. Right retrograde pyelogram revealed a slightly tortuous right ureter with a filling defect in the proximal aspects, consistent with the obstructing stone seen on recent CT.  The right renal pelvis is only mildly dilated with no other filling defects. 3. No intravesical lesions were noted.  Indication:  Allenmichael Mcpartlin is a 75 y.o. male with bilateral obstructing ureteral stones.  He has been consented for the above procedures, voices understanding and wishes to proceed.  Description of procedure:  After informed consent was obtained, the patient was brought to the operating room and general LMA anesthesia was administered. The patient was then placed in the dorsolithotomy position and prepped and draped in the usual sterile fashion. A timeout was performed. A 23 French rigid cystoscope was then inserted into the urethral meatus and advanced into the bladder under direct vision. A complete bladder survey revealed no intravesical pathology.  A 5 French ureteral catheter was then inserted into the left ureteral orifice and a retrograde pyelogram was obtained, with the findings  listed above.  A Glidewire was then used to intubate the lumen of the ureteral catheter and was advanced up to the left renal pelvis, under fluoroscopic guidance.  The catheter was then removed, leaving the wire in place.  A 6 French, 24 cm JJ stent was then placed over the wire and into good position within the left collecting system, confirming placement via fluoroscopy.  A 5 French ureteral catheter was then inserted into the right ureteral orifice and a retrograde pyelogram was obtained, with the findings listed above.  A Glidewire was then used to intubate the lumen of the ureteral catheter and was advanced up to the right renal pelvis, under fluoroscopic guidance.  The catheter was then removed, leaving the wire in place.  A 6 French, 24 cm JJ stent was then placed over the wire and into good position within the right collecting system, confirming placement via fluoroscopy.  The patient's bladder was drained.  He tolerated the procedure well and was transferred to the postanesthesia in stable condition.  Plan: Plan for bilateral ureteroscopy in approximately 2-3 weeks

## 2021-01-10 NOTE — Transfer of Care (Signed)
Immediate Anesthesia Transfer of Care Note  Patient: Peter Arnold  Procedure(s) Performed: CYSTOSCOPY WITH STENT PLACEMENT (Bilateral )  Patient Location: PACU  Anesthesia Type:General  Level of Consciousness: awake, alert , oriented and patient cooperative  Airway & Oxygen Therapy: Patient Spontanous Breathing and Patient connected to face mask oxygen  Post-op Assessment: Report given to RN and Post -op Vital signs reviewed and stable  Post vital signs: Reviewed and stable  Last Vitals:  Vitals Value Taken Time  BP 156/98 01/10/21 1751  Temp 37 C 01/10/21 1700  Pulse 72 01/10/21 1751  Resp 22 01/10/21 1737  SpO2 98 % 01/10/21 1751  Vitals shown include unvalidated device data.  Last Pain:  Vitals:   01/10/21 1730  TempSrc:   PainSc: 0-No pain         Complications: No complications documented.

## 2021-01-10 NOTE — Anesthesia Preprocedure Evaluation (Addendum)
Anesthesia Evaluation  Patient identified by MRN, date of birth, ID band Patient awake    Reviewed: Allergy & Precautions, NPO status , Patient's Chart, lab work & pertinent test results  Airway Mallampati: II  TM Distance: >3 FB Neck ROM: Full    Dental  (+) Poor Dentition, Missing   Pulmonary neg pulmonary ROS, Current SmokerPatient did not abstain from smoking.,    Pulmonary exam normal breath sounds clear to auscultation       Cardiovascular negative cardio ROS Normal cardiovascular exam Rhythm:Regular Rate:Normal     Neuro/Psych negative neurological ROS  negative psych ROS   GI/Hepatic negative GI ROS, Neg liver ROS,   Endo/Other  negative endocrine ROS  Renal/GU negative Renal ROS  negative genitourinary   Musculoskeletal negative musculoskeletal ROS (+)   Abdominal   Peds negative pediatric ROS (+)  Hematology negative hematology ROS (+)   Anesthesia Other Findings   Reproductive/Obstetrics negative OB ROS                            Anesthesia Physical Anesthesia Plan  ASA: II  Anesthesia Plan: General   Post-op Pain Management:    Induction: Intravenous  PONV Risk Score and Plan: 1 and Ondansetron and Treatment may vary due to age or medical condition  Airway Management Planned: LMA  Additional Equipment:   Intra-op Plan:   Post-operative Plan: Extubation in OR  Informed Consent: I have reviewed the patients History and Physical, chart, labs and discussed the procedure including the risks, benefits and alternatives for the proposed anesthesia with the patient or authorized representative who has indicated his/her understanding and acceptance.     Dental advisory given  Plan Discussed with: CRNA  Anesthesia Plan Comments:         Anesthesia Quick Evaluation

## 2021-01-10 NOTE — H&P (Signed)
PRE-OP H&P   Office Visit Report     01/10/2021   --------------------------------------------------------------------------------   Peter Arnold  MRN: [id number]  DOB: 1946/07/05, 75 year old Male  SSN:    PRIMARY CARE:  Gwyndolyn Saxon L. Gloriann Loan, MD  REFERRING:  Ammie Dalton, NP  PROVIDER:  Nicolette Bang, M.D.  TREATING:  Ellison Hughs, M.D.  LOCATION:  [facility] [phone]     --------------------------------------------------------------------------------   CC: I have pain in the flank.  HPI: Peter Arnold is a 75 year-old male established patient who is here for flank pain.  The problem is on the right side. His pain started about approximately 01/03/2021. The pain is sharp. The pain is intermittent. The pain does not radiate.   Sitting< makes the pain better. Nothing causes the pain to become worse. He was treated with the following pain medication(s): Ibuprofen.   He has had this same pain previously. He has had kidney stones.   The patient reports an episode of sharp, nonradiating right-sided flank pain that started approximately 1 week ago and has recurred intermittently since then. He denies any radiation of the pain. He reports mild nausea, but no emesis or fever/chills. He is urinating without difficulty denies dysuria or gross hematuria. He has a history of kidney stones, but is never required surgery.   The patient had a CT abdomen/pelvis on 01/02/2021 that revealed a 7 mm proximal right ureteral calculus with right-sided hydronephrosis as well as a 91mm left ureteral calculus associated with hydronephrosis that is likely chronic. His left-sided ureteral stone was noted on CT from 06/2020.     ALLERGIES: None   MEDICATIONS: Tamsulosin Hcl 0.4 mg capsule 1 capsule PO BID  Advil  Multivitamin     GU PSH: Cystoscopy - 2019 Locm 300-399Mg /Ml Iodine,1Ml - 2019     NON-GU PSH: Hernia Repair - 2019     GU PMH: BPH w/LUTS - 05/20/2020, -  10/22/2019, - 05/08/2019, - 2020, - 2019, - 2019, - 2019, - 2019 Nocturia - 05/20/2020 Prostate Cancer - 05/20/2020, - 10/22/2019, - 05/08/2019, - 2020, - 2019, - 2019, - 2019, - 2019, - 2019    NON-GU PMH: None   FAMILY HISTORY: Death In The Family Father - Mother Death In The Family Mother - Mother Esophageal Cancer - Father Prostate Cancer - Grandfather   SOCIAL HISTORY: Marital Status: Divorced Preferred Language: English; Ethnicity: Not Hispanic Or Latino; Race: White Current Smoking Status: Patient smokes. Has smoked since 09/11/1987. Smokes 1 pack per day.   Tobacco Use Assessment Completed: Used Tobacco in last 30 days? Has never drank.  Drinks 2 caffeinated drinks per day. 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    REVIEW OF SYSTEMS:    GU Review Male:   Patient denies get up at night to urinate, trouble starting your stream, hard to postpone urination, stream starts and stops, burning/ pain with urination, frequent urination, leakage of urine, have to strain to urinate , penile pain, and erection problems.  Gastrointestinal (Upper):   Patient denies nausea, vomiting, and indigestion/ heartburn.  Gastrointestinal (Lower):   Patient denies diarrhea and constipation.  Constitutional:   Patient denies fever, night sweats, weight loss, and fatigue.  Skin:   Patient denies skin rash/ lesion and itching.  Eyes:   Patient denies blurred vision and double vision.  Ears/ Nose/ Throat:   Patient denies sore throat and sinus problems.  Hematologic/Lymphatic:   Patient denies swollen glands and easy bruising.  Cardiovascular:   Patient denies leg  swelling and chest pains.  Respiratory:   Patient denies cough and shortness of breath.  Endocrine:   Patient denies excessive thirst.  Musculoskeletal:   Patient reports back pain. Patient denies joint pain.  Neurological:   Patient denies headaches and dizziness.  Psychologic:   Patient denies depression and anxiety.   Notes: Pt had  CT scan 01/02/21 at [facility] in [place]:      01/10/2021 10:23 AM  Weight 156 lb / 70.76 kg  Height 72 in / 182.88 cm  BP 183/86 mmHg  Pulse 76 /min  Temperature 97.3 F / 36.2 C  BMI 21.2 kg/m   MULTI-SYSTEM PHYSICAL EXAMINATION:    Constitutional: Well-nourished. No physical deformities. Normally developed. Good grooming.  Respiratory: No labored breathing, no use of accessory muscles.   Neurologic / Psychiatric: Oriented to time, oriented to place, oriented to person. No depression, no anxiety, no agitation.  Musculoskeletal: Normal gait and station of head and neck.     Complexity of Data:  X-Ray Review: C.T. Abdomen/Pelvis: Reviewed Films. Reviewed Report. Discussed With Patient.     05/13/20 10/19/19 05/12/19 11/07/18 11/12/17  PSA  Total PSA <0.015 ng/mL <0.01 ng/mL <0.015 ng/mL <0.015 ng/mL 16.10 ng/mL    11/12/17  Hormones  Testosterone, Total <10 ng/dL   Notes:                     CLINICAL DATA: Right flank pain x4 days, history of prostate cancer   EXAM:  CT ABDOMEN AND PELVIS WITHOUT CONTRAST   TECHNIQUE:  Multidetector CT imaging of the abdomen and pelvis was performed  following the standard protocol without IV contrast.   COMPARISON: 07/05/2020   FINDINGS:  Lower chest: Linear scarring/atelectasis in the left lower lobe.   Hepatobiliary: Unenhanced liver is unremarkable.   Tiny layering gallstone (series 3/image 35), without associated  inflammatory changes. No intrahepatic or extrahepatic ductal  dilatation.   Pancreas: Within normal limits.   Spleen: Within normal limits   Adrenals/Urinary Tract: Adrenal glands are within normal limits.   Bilateral renal cysts, including a dominant 4.3 cm cyst in the  medial left upper kidney (series 3/image 28), poorly evaluated.   Multiple nonobstructing bilateral renal calculi, measuring up to 9  mm in the left lower pole (series 3/image 38).   Moderate left hydronephrosis with  associated 16 mm proximal left  ureteral calculus at the L3 level (coronal image 49), unchanged.   Mild right hydronephrosis with associated 7 mm proximal right  ureteral calculus at the L3 level (coronal image 49), new.   Bladder is within normal limits.   Stomach/Bowel: Stomach is within normal limits.   No evidence of bowel obstruction.   Normal appendix (series 3/image 61).   No colonic wall thickening or inflammatory changes.   Vascular/Lymphatic: No evidence of abdominal aortic aneurysm.   Atherosclerotic calcifications of the abdominal aorta and branch  vessels. Retroaortic left renal vein.   No suspicious abdominopelvic lymphadenopathy.   Reproductive: Prostatomegaly in this patient with known prostate  cancer.   Other: No abdominopelvic ascites.   Musculoskeletal: Degenerative changes of the visualized  thoracolumbar spine. No focal osseous lesions.   IMPRESSION:  7 mm proximal right ureteral calculus with associated mild right  hydronephrosis, new.   16 mm proximal left ureteral calculus with associated moderate left  hydronephrosis, chronic.   Additional bilateral nonobstructing renal calculi measuring up to 9  mm in the left lower pole.   Additional  stable ancillary findings as above.    Electronically Signed  By: Julian Hy M.D.  On: 01/02/2021 18:12   PROCEDURES:          Urinalysis w/Scope Dipstick Dipstick Cont'd Micro  Color: Yellow Bilirubin: Neg mg/dL WBC/hpf: 0 - 5/hpf  Appearance: Clear Ketones: Neg mg/dL RBC/hpf: 0 - 2/hpf  Specific Gravity: 1.020 Blood: Trace ery/uL Bacteria: NS (Not Seen)  pH: <=5.0 Protein: Neg mg/dL Cystals: NS (Not Seen)  Glucose: Neg mg/dL Urobilinogen: 0.2 mg/dL Casts: NS (Not Seen)    Nitrites: Neg Trichomonas: Not Present    Leukocyte Esterase: Neg leu/uL Mucous: Not Present      Epithelial Cells: NS (Not Seen)      Yeast: NS (Not Seen)      Sperm: Not Present    ASSESSMENT:      ICD-10 Details  1  GU:   Flank Pain - R10.84 Right, Undiagnosed New Problem  2   Ureteral calculus - N20.1 Bilateral, Undiagnosed New Problem  3   Ureteral obstruction secondary to calculous - N13.2 Bilateral, Acute, Uncomplicated   PLAN:           Schedule Return Visit/Planned Activity: ASAP - Schedule Surgery          Document Letter(s):  Created for Patient: Clinical Summary         Notes:   -The risks, benefits and alternatives of cystoscopy with BILATERAL JJ stent placement was discussed with the patient. Risks include, but are not limited to: bleeding, urinary tract infection, ureteral injury, ureteral stricture disease, chronic pain, urinary symptoms, bladder injury, stent migration, the need for nephrostomy tube placement, MI, CVA, DVT, PE and the inherent risks with general anesthesia. The patient voices understanding and wishes to proceed.

## 2021-01-11 ENCOUNTER — Encounter (HOSPITAL_COMMUNITY): Payer: Self-pay | Admitting: Urology

## 2021-01-17 ENCOUNTER — Other Ambulatory Visit: Payer: Self-pay | Admitting: Urology

## 2021-01-26 ENCOUNTER — Encounter (HOSPITAL_BASED_OUTPATIENT_CLINIC_OR_DEPARTMENT_OTHER): Payer: Self-pay | Admitting: Urology

## 2021-01-27 ENCOUNTER — Encounter (HOSPITAL_BASED_OUTPATIENT_CLINIC_OR_DEPARTMENT_OTHER): Payer: Self-pay | Admitting: Urology

## 2021-01-27 ENCOUNTER — Other Ambulatory Visit: Payer: Self-pay

## 2021-01-27 NOTE — Progress Notes (Signed)
Spoke w/ via phone for pre-op interview---pt Lab needs dos----none               Lab results------none COVID test -----patient states asymptomatic no test needed Arrive at -------1030 am 02-01-2021 NPO after MN NO Solid Food.  Clear liquids from MN until--930 am  Then npo Med rec completed Medications to take morning of surgery -----flonase nasal spray, flomax, tramadol prn Diabetic medication ----- Patient instructed to bring photo id and insurance card day of surgery Friend diane hayworth will stay   for 24 hours after surgery  Patient Special Instructions -----none Pre-Op special Istructions -----none Patient verbalized understanding of instructions that were given at this phone interview. Patient denies shortness of breath, chest pain, fever, cough at this phone interview.

## 2021-02-01 ENCOUNTER — Encounter (HOSPITAL_BASED_OUTPATIENT_CLINIC_OR_DEPARTMENT_OTHER)
Admission: RE | Disposition: A | Discharge status: Home / Self Care | Payer: Self-pay | Source: Home / Self Care | Attending: Urology

## 2021-02-01 ENCOUNTER — Ambulatory Visit (HOSPITAL_BASED_OUTPATIENT_CLINIC_OR_DEPARTMENT_OTHER): Payer: Medicare Other | Admitting: Anesthesiology

## 2021-02-01 ENCOUNTER — Ambulatory Visit (HOSPITAL_BASED_OUTPATIENT_CLINIC_OR_DEPARTMENT_OTHER)
Admission: RE | Admit: 2021-02-01 | Discharge: 2021-02-01 | Disposition: A | Discharge status: Home / Self Care | Payer: Medicare Other | Attending: Urology | Admitting: Urology

## 2021-02-01 ENCOUNTER — Encounter (HOSPITAL_BASED_OUTPATIENT_CLINIC_OR_DEPARTMENT_OTHER): Payer: Self-pay | Admitting: Urology

## 2021-02-01 DIAGNOSIS — N135 Crossing vessel and stricture of ureter without hydronephrosis: Secondary | ICD-10-CM | POA: Diagnosis present

## 2021-02-01 DIAGNOSIS — N202 Calculus of kidney with calculus of ureter: Secondary | ICD-10-CM | POA: Insufficient documentation

## 2021-02-01 DIAGNOSIS — F17210 Nicotine dependence, cigarettes, uncomplicated: Secondary | ICD-10-CM | POA: Insufficient documentation

## 2021-02-01 DIAGNOSIS — Z800 Family history of malignant neoplasm of digestive organs: Secondary | ICD-10-CM | POA: Insufficient documentation

## 2021-02-01 DIAGNOSIS — Z8546 Personal history of malignant neoplasm of prostate: Secondary | ICD-10-CM | POA: Insufficient documentation

## 2021-02-01 DIAGNOSIS — Z923 Personal history of irradiation: Secondary | ICD-10-CM | POA: Diagnosis not present

## 2021-02-01 DIAGNOSIS — Z8042 Family history of malignant neoplasm of prostate: Secondary | ICD-10-CM | POA: Insufficient documentation

## 2021-02-01 DIAGNOSIS — Z87442 Personal history of urinary calculi: Secondary | ICD-10-CM | POA: Insufficient documentation

## 2021-02-01 HISTORY — PX: CYSTOSCOPY/URETEROSCOPY/HOLMIUM LASER/STENT PLACEMENT: SHX6546

## 2021-02-01 HISTORY — DX: Other seasonal allergic rhinitis: J30.2

## 2021-02-01 HISTORY — DX: Disorder of teeth and supporting structures, unspecified: K08.9

## 2021-02-01 HISTORY — DX: Retention of urine, unspecified: R33.9

## 2021-02-01 HISTORY — DX: Presence of dental prosthetic device (complete) (partial): Z97.2

## 2021-02-01 HISTORY — DX: Personal history of urinary calculi: Z87.442

## 2021-02-01 HISTORY — DX: Unspecified osteoarthritis, unspecified site: M19.90

## 2021-02-01 HISTORY — DX: Frequency of micturition: R35.0

## 2021-02-01 SURGERY — CYSTOSCOPY/URETEROSCOPY/HOLMIUM LASER/STENT PLACEMENT
Anesthesia: General | Site: Renal | Laterality: Bilateral

## 2021-02-01 MED ORDER — OXYCODONE HCL 5 MG PO TABS: 5.0000 mg | ORAL_TABLET | Freq: Once | ORAL | Status: DC | PRN

## 2021-02-01 MED ORDER — ONDANSETRON HCL 4 MG/2ML IJ SOLN
INTRAMUSCULAR | Status: DC | PRN
  Administered 2021-02-01: 4 mg via INTRAVENOUS

## 2021-02-01 MED ORDER — EPHEDRINE 5 MG/ML INJ
INTRAVENOUS | Status: AC
  Filled 2021-02-01: qty 10

## 2021-02-01 MED ORDER — FENTANYL CITRATE (PF) 100 MCG/2ML IJ SOLN
INTRAMUSCULAR | Status: DC | PRN
  Administered 2021-02-01 (×3): 25 ug via INTRAVENOUS
  Administered 2021-02-01: 50 ug via INTRAVENOUS
  Administered 2021-02-01: 25 ug via INTRAVENOUS

## 2021-02-01 MED ORDER — PROPOFOL 10 MG/ML IV BOLUS
INTRAVENOUS | Status: DC | PRN
  Administered 2021-02-01: 130 mg via INTRAVENOUS

## 2021-02-01 MED ORDER — CIPROFLOXACIN IN D5W 400 MG/200ML IV SOLN
400.0000 mg | Freq: Once | INTRAVENOUS | Status: AC
  Administered 2021-02-01: 400 mg via INTRAVENOUS

## 2021-02-01 MED ORDER — DEXAMETHASONE SODIUM PHOSPHATE 10 MG/ML IJ SOLN
INTRAMUSCULAR | Status: AC
  Filled 2021-02-01: qty 1

## 2021-02-01 MED ORDER — LACTATED RINGERS IV SOLN: INTRAVENOUS | Status: DC

## 2021-02-01 MED ORDER — LIDOCAINE 2% (20 MG/ML) 5 ML SYRINGE
INTRAMUSCULAR | Status: AC
  Filled 2021-02-01: qty 5

## 2021-02-01 MED ORDER — CEPHALEXIN 500 MG PO CAPS: 500.0000 mg | ORAL_CAPSULE | Freq: Two times a day (BID) | ORAL | 0 refills | Status: AC

## 2021-02-01 MED ORDER — FENTANYL CITRATE (PF) 100 MCG/2ML IJ SOLN: 25.0000 ug | INTRAMUSCULAR | Status: DC | PRN

## 2021-02-01 MED ORDER — LIDOCAINE 2% (20 MG/ML) 5 ML SYRINGE
INTRAMUSCULAR | Status: DC | PRN
  Administered 2021-02-01: 80 mg via INTRAVENOUS

## 2021-02-01 MED ORDER — PROMETHAZINE HCL 25 MG/ML IJ SOLN: 6.2500 mg | INTRAMUSCULAR | Status: DC | PRN

## 2021-02-01 MED ORDER — OXYCODONE HCL 5 MG/5ML PO SOLN
5.0000 mg | Freq: Once | ORAL | Status: DC | PRN
Start: 2021-02-01 — End: 2021-02-01

## 2021-02-01 MED ORDER — ACETAMINOPHEN 500 MG PO TABS
1000.0000 mg | ORAL_TABLET | Freq: Once | ORAL | Status: AC
  Administered 2021-02-01: 1000 mg via ORAL

## 2021-02-01 MED ORDER — FENTANYL CITRATE (PF) 100 MCG/2ML IJ SOLN
INTRAMUSCULAR | Status: AC
  Filled 2021-02-01: qty 2

## 2021-02-01 MED ORDER — PHENYLEPHRINE 40 MCG/ML (10ML) SYRINGE FOR IV PUSH (FOR BLOOD PRESSURE SUPPORT)
PREFILLED_SYRINGE | INTRAVENOUS | Status: DC | PRN
  Administered 2021-02-01: 120 ug via INTRAVENOUS
  Administered 2021-02-01 (×2): 80 ug via INTRAVENOUS

## 2021-02-01 MED ORDER — CIPROFLOXACIN IN D5W 400 MG/200ML IV SOLN
INTRAVENOUS | Status: AC
  Filled 2021-02-01: qty 200

## 2021-02-01 MED ORDER — TRAMADOL HCL 50 MG PO TABS: 50.0000 mg | ORAL_TABLET | Freq: Four times a day (QID) | ORAL | 0 refills | Status: DC | PRN

## 2021-02-01 MED ORDER — DEXAMETHASONE SODIUM PHOSPHATE 10 MG/ML IJ SOLN
INTRAMUSCULAR | Status: DC | PRN
  Administered 2021-02-01: 4 mg via INTRAVENOUS

## 2021-02-01 MED ORDER — PROPOFOL 10 MG/ML IV BOLUS
INTRAVENOUS | Status: AC
  Filled 2021-02-01: qty 20

## 2021-02-01 MED ORDER — ACETAMINOPHEN 500 MG PO TABS
ORAL_TABLET | ORAL | Status: AC
  Filled 2021-02-01: qty 2

## 2021-02-01 MED ORDER — EPHEDRINE SULFATE-NACL 50-0.9 MG/10ML-% IV SOSY
PREFILLED_SYRINGE | INTRAVENOUS | Status: DC | PRN
  Administered 2021-02-01 (×2): 10 mg via INTRAVENOUS

## 2021-02-01 MED ORDER — SODIUM CHLORIDE 0.9 % IR SOLN
Status: DC | PRN
  Administered 2021-02-01: 3000 mL via INTRAVESICAL

## 2021-02-01 MED ORDER — PHENYLEPHRINE 40 MCG/ML (10ML) SYRINGE FOR IV PUSH (FOR BLOOD PRESSURE SUPPORT)
PREFILLED_SYRINGE | INTRAVENOUS | Status: AC
  Filled 2021-02-01: qty 10

## 2021-02-01 MED ORDER — ONDANSETRON HCL 4 MG/2ML IJ SOLN
INTRAMUSCULAR | Status: AC
  Filled 2021-02-01: qty 2

## 2021-02-01 SURGICAL SUPPLY — 28 items
APL SKNCLS STERI-STRIP NONHPOA (GAUZE/BANDAGES/DRESSINGS)
BAG DRAIN URO-CYSTO SKYTR STRL (DRAIN) ×2 IMPLANT
BAG DRN UROCATH (DRAIN) ×1
BASKET STONE 1.7 NGAGE (UROLOGICAL SUPPLIES) IMPLANT
BASKET ZERO TIP NITINOL 2.4FR (BASKET) ×2 IMPLANT
BENZOIN TINCTURE PRP APPL 2/3 (GAUZE/BANDAGES/DRESSINGS) IMPLANT
BSKT STON RTRVL ZERO TP 2.4FR (BASKET) ×1
CATH URET 5FR 28IN OPEN ENDED (CATHETERS) ×2 IMPLANT
CLOTH BEACON ORANGE TIMEOUT ST (SAFETY) ×2 IMPLANT
COVER DOME SNAP 22 D (MISCELLANEOUS) ×2 IMPLANT
EXTRACTOR STONE 1.7FRX115CM (UROLOGICAL SUPPLIES) ×2 IMPLANT
FIBER LASER FLEXIVA 365 (UROLOGICAL SUPPLIES) IMPLANT
GLOVE SURG ENC MOIS LTX SZ7.5 (GLOVE) ×2 IMPLANT
GOWN STRL REUS W/TWL XL LVL3 (GOWN DISPOSABLE) ×2 IMPLANT
GUIDEWIRE STR DUAL SENSOR (WIRE) IMPLANT
GUIDEWIRE ZIPWRE .038 STRAIGHT (WIRE) ×2 IMPLANT
IV NS IRRIG 3000ML ARTHROMATIC (IV SOLUTION) ×4 IMPLANT
KIT TURNOVER CYSTO (KITS) ×2 IMPLANT
MANIFOLD NEPTUNE II (INSTRUMENTS) ×2 IMPLANT
NS IRRIG 500ML POUR BTL (IV SOLUTION) ×2 IMPLANT
PACK CYSTO (CUSTOM PROCEDURE TRAY) ×2 IMPLANT
STENT URET 6FRX24 CONTOUR (STENTS) ×4 IMPLANT
STRIP CLOSURE SKIN 1/2X4 (GAUZE/BANDAGES/DRESSINGS) IMPLANT
SYR 10ML LL (SYRINGE) ×2 IMPLANT
TRACTIP FLEXIVA PULS ID 200XHI (Laser) ×1 IMPLANT
TRACTIP FLEXIVA PULSE ID 200 (Laser) ×2
TUBE CONNECTING 12X1/4 (SUCTIONS) IMPLANT
TUBING UROLOGY SET (TUBING) ×2 IMPLANT

## 2021-02-01 NOTE — Op Note (Signed)
Operative Note  Preoperative diagnosis:  1.  Obstructing and impacted 16mm left UPJ calculus 2.  Obstructing and impacted 7 mm right UPJ calculus 3.  Bilateral nonobstructing renal stones  Postoperative diagnosis: 1.  Obstructing and impacted 10mm left UPJ calculus 2.  Obstructing and impacted 7 mm right UPJ calculus 3.  Bilateral nonobstructing renal stones  Procedure(s): 1.  Cystoscopy with bilateral ureteroscopy, bilateral holmium laser lithotripsy and bilateral JJ stent exchange   Surgeon: Ellison Hughs, MD  Assistants:  None  Anesthesia:  General  Complications:  None  EBL: Less than 5 mL  Specimens: 1.  Previously placed bilateral ureteral stents were removed intact, inspected and discarded  Drains/Catheters: 1.  Bilateral 6 French, 24 cm JJ stents  Intraoperative findings:   1. Both UPJ stones were severely impacted into the wall of the proximal ureter.  The right UPJ had a 1 cm area of mucosal disruption along its medial aspects.   Indication:  Peter Arnold is a 75 y.o. male initially seen on 01/10/2022 due to bilateral ureteral obstruction due to bilateral UPJ calculi.  He was taken urgently to the operating room and had bilateral ureteral stents placed on that day.  He is here today for definitive stone treatment.  He has been consented for the above procedures, voices understanding wishes to proceed.  Description of procedure:  After informed consent was obtained, the patient was brought to the operating room and general LMA anesthesia was administered. The patient was then placed in the dorsolithotomy position and prepped and draped in the usual sterile fashion. A timeout was performed. A 23 French rigid cystoscope was then inserted into the urethral meatus and advanced into the bladder under direct vision. A complete bladder survey revealed no intravesical pathology.  His previously placed left JJ stent was grasped at its distal curl and retracted to the  urethral meatus.  A Glidewire was then advanced through the lumen of the stent and advanced up to the left renal pelvis, fluoroscopic guidance.  The stent was then removed intact, inspected and discarded.  A flexible ureteroscope was then advanced alongside the wire up to the level of his obstructing and impacted stone.  A 200 m holmium laser was then used to fracture the stone into numerous smaller pieces, which eventually migrated into the left renal pelvis.  Stones that migrated into the left renal pelvis were then dusted with the holmium laser.  Reinspection of the left UPJ revealed circumferential edema and mucosal erythema.  The flexible ureteroscope was then removed under direct vision, identifying no other areas of ureteral trauma or sizable stone burden.  A new 6 [place], 24 cm JJ stent was then placed over the wire and into good position within the left collecting system, confirming placement via fluoroscopy.  The right ureteral stent was then removed in the same fashion.  The flexible ureteroscope was then advanced alongside the Glidewire and up to the level of his obstructing and impacted right UPJ stone.  The 200 m holmium laser was then used to fracture the stone into numerous smaller pieces.  The a 1 cm portion of the medial aspects of the ureter/UPJ was disrupted from the impacted stone.  Once the stone was fractured, it migrated into the renal pelvis.  The holmium laser was then used to dust the remaining stone fragments.  There were no remaining stone fragments within the lumen of the ureter or a lot of the right UVJ rent.  Flexible ureteroscope was then removed under direct  vision.  A new 6 [place], 24 cm JJ stent was then placed over the wire and into good position within the right collecting system, confirming placement via fluoroscopy.  The patient's bladder was drained.  Tolerated the procedure well and was transferred to the postanesthesia in stable condition.  Plan: Follow-up in 10  days for office cystoscopy and stent removal

## 2021-02-01 NOTE — Anesthesia Preprocedure Evaluation (Addendum)
Anesthesia Evaluation  Patient identified by MRN, date of birth, ID band Patient awake    Reviewed: Allergy & Precautions, NPO status , Patient's Chart, lab work & pertinent test results  History of Anesthesia Complications Negative for: history of anesthetic complications  Airway Mallampati: II  TM Distance: >3 FB Neck ROM: Full    Dental  (+) Partial Upper   Pulmonary Current Smoker and Patient abstained from smoking.,    Pulmonary exam normal        Cardiovascular negative cardio ROS Normal cardiovascular exam     Neuro/Psych negative neurological ROS  negative psych ROS   GI/Hepatic negative GI ROS, Neg liver ROS,   Endo/Other  negative endocrine ROS  Renal/GU negative Renal ROS  negative genitourinary   Musculoskeletal  (+) Arthritis ,   Abdominal   Peds  Hematology negative hematology ROS (+)   Anesthesia Other Findings Day of surgery medications reviewed with patient.  Reproductive/Obstetrics negative OB ROS                            Anesthesia Physical Anesthesia Plan  ASA: II  Anesthesia Plan: General   Post-op Pain Management:    Induction: Intravenous  PONV Risk Score and Plan: 2 and Treatment may vary due to age or medical condition, Ondansetron and Dexamethasone  Airway Management Planned: LMA  Additional Equipment:   Intra-op Plan:   Post-operative Plan: Extubation in OR  Informed Consent: I have reviewed the patients History and Physical, chart, labs and discussed the procedure including the risks, benefits and alternatives for the proposed anesthesia with the patient or authorized representative who has indicated his/her understanding and acceptance.     Dental advisory given  Plan Discussed with: CRNA  Anesthesia Plan Comments:        Anesthesia Quick Evaluation

## 2021-02-01 NOTE — H&P (Signed)
Urology Preoperative H&P   Chief Complaint: Kidney stones  History of Present Illness: Peter Arnold is a 75 y.o. male with bilateral obstructing ureteral stones, status post bilateral ureteral stent placement on 01/10/2021.  The patient is here today for definitive stone treatment.  He denies interval UTIs, fever/chills or significant flank pain.  He does report ongoing dysuria and urinary urgency likely secondary to his stents.   Past Medical History:  Diagnosis Date  . Arthritis    lower back  . History of kidney stones   . Incomplete bladder emptying   . Poor dentition    chipped teeth per pt  . Prostate cancer ([place]) 2019   40 radiation tx done 2019  . Seasonal allergies    spring anf fall  . Urinary frequency   . Wears partial dentures    upper    Past Surgical History:  Procedure Laterality Date  . colonscopy  fall 2021   normal  . CYSTOSCOPY WITH STENT PLACEMENT Bilateral 01/10/2021   Procedure: CYSTOSCOPY WITH STENT PLACEMENT;  Surgeon: Ceasar Mons, MD;  Location: [facility];  Service: Urology;  Laterality: Bilateral;  . HERNIA REPAIR  2019   inguinal left  . PROSTATE BIOPSY  2019    Allergies: No Known Allergies  Family History  Problem Relation Age of Onset  . Cancer Father        esophageal  . Cancer Paternal Grandfather        prostate    Social History:  reports that he has been smoking cigarettes. He has a 30.00 pack-year smoking history. He has never used smokeless tobacco. He reports that he does not drink alcohol and does not use drugs.  ROS: A complete review of systems was performed.  All systems are negative except for pertinent findings as noted.  Physical Exam:  Vital signs in last 24 hours: Temp:  [98.2 F (36.8 C)] 98.2 F (36.8 C) (05/25 1045) Pulse Rate:  [74] 74 (05/25 1045) Resp:  [14] 14 (05/25 1045) BP: (143)/(80) 143/80 (05/25 1045) SpO2:  [100 %] 100 % (05/25 1045) Weight:  [68.1 kg] 68.1 kg (05/25 1045) Constitutional:   Alert and oriented, No acute distress Cardiovascular: Regular rate and rhythm, No JVD Respiratory: Normal respiratory effort, Lungs clear bilaterally GI: Abdomen is soft, nontender, nondistended, no abdominal masses GU: No CVA tenderness Lymphatic: No lymphadenopathy Neurologic: Grossly intact, no focal deficits Psychiatric: Normal mood and affect  Laboratory Data:  No results for input(s): WBC, HGB, HCT, PLT in the last 72 hours.  No results for input(s): NA, K, CL, GLUCOSE, BUN, CALCIUM, CREATININE in the last 72 hours.  Invalid input(s): CO3   No results found for this or any previous visit (from the past 24 hour(s)). No results found for this or any previous visit (from the past 240 hour(s)).  Renal Function: No results for input(s): CREATININE in the last 168 hours. CrCl cannot be calculated (Patient's most recent lab result is older than the maximum 21 days allowed.).  Radiologic Imaging: No results found.  I independently reviewed the above imaging studies.  Assessment and Plan Peter Arnold is a 75 y.o. male with bilateral obstructing ureteral stones  The risks, benefits and alternatives of cystoscopy with bilateral ureteroscopy, laser lithotripsy and ureteral stent placement was discussed the patient.  Risks included, but are not limited to: bleeding, urinary tract infection, ureteral injury/avulsion, ureteral stricture formation, retained stone fragments, the possibility that multiple surgeries may be required to treat the stone(s), MI, stroke,  PE and the inherent risks of general anesthesia.  The patient voices understanding and wishes to proceed.      Ellison Hughs, MD 02/01/2021, 12:30 PM  [facility] Pager: [phone]

## 2021-02-01 NOTE — Transfer of Care (Signed)
Immediate Anesthesia Transfer of Care Note  Patient: Peter Arnold  Procedure(s) Performed: CYSTOSCOPY/URETEROSCOPY/HOLMIUM LASER/STENT PLACEMENT (Bilateral Renal)  Patient Location: PACU  Anesthesia Type:General  Level of Consciousness: sedated  Airway & Oxygen Therapy: Patient Spontanous Breathing and Patient connected to nasal cannula oxygen  Post-op Assessment: Report given to RN  Post vital signs: Reviewed and stable  Last Vitals:  Vitals Value Taken Time  BP 117/78 02/01/21 1442  Temp    Pulse 69 02/01/21 1445  Resp 16 02/01/21 1445  SpO2 92 % 02/01/21 1445  Vitals shown include unvalidated device data.  Last Pain:  Vitals:   02/01/21 1045  TempSrc: Oral  PainSc: 0-No pain      Patients Stated Pain Goal: 3 (50/01/64 2903)  Complications: No complications documented.

## 2021-02-01 NOTE — Progress Notes (Signed)
Bilateral stents removed by Dr. Lovena Neighbours

## 2021-02-01 NOTE — Discharge Instructions (Signed)
CYSTOSCOPY HOME CARE INSTRUCTIONS  Activity: Rest for the remainder of the day.  Do not drive or operate equipment today.  You may resume normal activities in one to two days as instructed by your physician.   Meals: Drink plenty of liquids and eat light foods such as gelatin or soup this evening.  You may return to a normal meal plan tomorrow.  Return to Work: You may return to work in one to two days or as instructed by your physician.  Special Instructions / Symptoms: Call your physician if any of these symptoms occur:   -persistent or heavy bleeding  -bleeding which continues after first few urination  -large blood clots that are difficult to pass  -urine stream diminishes or stops completely  -fever equal to or higher than 101 degrees Farenheit.  -cloudy urine with a strong, foul odor  -severe pain  You may feel some burning pain when you urinate.  This should disappear with time.  Applying moist heat to the lower abdomen or a hot tub bath may help relieve the pain. \   Post Anesthesia Home Care Instructions  Activity: Get plenty of rest for the remainder of the day. A responsible individual must stay with you for 24 hours following the procedure.  For the next 24 hours, DO NOT: -Drive a car -Paediatric nurse -Drink alcoholic beverages -Take any medication unless instructed by your physician -Make any legal decisions or sign important papers.  Meals: Start with liquid foods such as gelatin or soup. Progress to regular foods as tolerated. Avoid greasy, spicy, heavy foods. If nausea and/or vomiting occur, drink only clear liquids until the nausea and/or vomiting subsides. Call your physician if vomiting continues.  Special Instructions/Symptoms: Your throat may feel dry or sore from the anesthesia or the breathing tube placed in your throat during surgery. If this causes discomfort, gargle with warm salt water. The discomfort should disappear within 24 hours.  **NO  acetaminophen/Tylenol until 5pm today if needed for pain.

## 2021-02-01 NOTE — Anesthesia Postprocedure Evaluation (Signed)
Anesthesia Post Note  Patient: Peter Arnold  Procedure(s) Performed: CYSTOSCOPY/URETEROSCOPY/HOLMIUM LASER/STENT PLACEMENT (Bilateral Renal)     Patient location during evaluation: PACU Anesthesia Type: General Level of consciousness: awake and alert and oriented Pain management: pain level controlled Vital Signs Assessment: post-procedure vital signs reviewed and stable Respiratory status: spontaneous breathing, nonlabored ventilation and respiratory function stable Cardiovascular status: blood pressure returned to baseline Postop Assessment: no apparent nausea or vomiting Anesthetic complications: no   No complications documented.  Last Vitals:  Vitals:   02/01/21 1500 02/01/21 1515  BP: 122/73 136/83  Pulse: 64 64  Resp: 11 10  Temp:  (!) 36.4 C  SpO2: 97% 100%    Last Pain:  Vitals:   02/01/21 1515  TempSrc:   PainSc: 0-No pain                 Brennan Bailey

## 2021-02-01 NOTE — Anesthesia Procedure Notes (Addendum)
Procedure Name: LMA Insertion Date/Time: 02/01/2021 12:59 PM Performed by: Bonney Aid, CRNA Pre-anesthesia Checklist: Patient identified, Emergency Drugs available, Suction available and Patient being monitored Patient Re-evaluated:Patient Re-evaluated prior to induction Oxygen Delivery Method: Circle system utilized Preoxygenation: Pre-oxygenation with 100% oxygen Induction Type: IV induction Ventilation: Mask ventilation without difficulty LMA: LMA inserted LMA Size: 4.0 Number of attempts: 1 Placement Confirmation: positive ETCO2 Tube secured with: Tape Dental Injury: Teeth and Oropharynx as per pre-operative assessment  Comments: Seated between front missing teeth

## 2021-02-02 ENCOUNTER — Encounter (HOSPITAL_BASED_OUTPATIENT_CLINIC_OR_DEPARTMENT_OTHER): Payer: Self-pay | Admitting: Urology

## 2023-06-04 ENCOUNTER — Other Ambulatory Visit (HOSPITAL_COMMUNITY): Payer: Self-pay | Admitting: Urology

## 2023-06-04 DIAGNOSIS — C61 Malignant neoplasm of prostate: Secondary | ICD-10-CM

## 2023-06-13 ENCOUNTER — Encounter (HOSPITAL_COMMUNITY)
Admission: RE | Admit: 2023-06-13 | Discharge: 2023-06-13 | Disposition: A | Discharge status: Home / Self Care | Payer: Medicare HMO | Source: Ambulatory Visit | Attending: Urology | Admitting: Urology

## 2023-06-13 DIAGNOSIS — C61 Malignant neoplasm of prostate: Secondary | ICD-10-CM | POA: Insufficient documentation

## 2023-06-13 MED ORDER — FLOTUFOLASTAT F 18 GALLIUM 296-5846 MBQ/ML IV SOLN
8.6000 | Freq: Once | INTRAVENOUS | Status: AC
  Administered 2023-06-13: 8.6 via INTRAVENOUS

## 2024-01-08 ENCOUNTER — Other Ambulatory Visit (HOSPITAL_COMMUNITY): Payer: Self-pay | Admitting: Urology

## 2024-01-08 DIAGNOSIS — R9721 Rising PSA following treatment for malignant neoplasm of prostate: Secondary | ICD-10-CM

## 2024-01-08 DIAGNOSIS — C61 Malignant neoplasm of prostate: Secondary | ICD-10-CM

## 2024-01-21 ENCOUNTER — Encounter (HOSPITAL_COMMUNITY)
Admission: RE | Admit: 2024-01-21 | Discharge: 2024-01-21 | Disposition: A | Discharge status: Home / Self Care | Source: Ambulatory Visit | Attending: Urology | Admitting: Urology

## 2024-01-21 DIAGNOSIS — C61 Malignant neoplasm of prostate: Secondary | ICD-10-CM | POA: Insufficient documentation

## 2024-01-21 DIAGNOSIS — R9721 Rising PSA following treatment for malignant neoplasm of prostate: Secondary | ICD-10-CM | POA: Diagnosis present

## 2024-01-21 MED ORDER — FLOTUFOLASTAT F 18 GALLIUM 296-5846 MBQ/ML IV SOLN
8.5000 | Freq: Once | INTRAVENOUS | Status: AC
  Administered 2024-01-21: 8.5 via INTRAVENOUS
  Filled 2024-01-21: qty 9

## 2024-03-27 ENCOUNTER — Other Ambulatory Visit: Payer: Self-pay | Admitting: Urology

## 2024-03-31 ENCOUNTER — Encounter (HOSPITAL_COMMUNITY): Payer: Self-pay | Admitting: Urology

## 2024-03-31 NOTE — Progress Notes (Signed)
 Spoke w/ via phone for pre-op interview--- [facility] Lab needs dos----  NONE        Lab results------ COVID test -----patient states asymptomatic no test needed Arrive at -------1100 NPO after MN NO Solid Food.  Clear liquids from MN until---1000 Pre-Surgery Ensure or G2:  Med rec completed Medications to take morning of surgery -----NONE Diabetic medication -----  GLP1 agonist last dose: GLP1 instructions:  Patient instructed no nail polish to be worn day of surgery Patient instructed to bring photo id and insurance card day of surgery Patient aware to have Driver (ride ) / caregiver    for 24 hours after surgery - Diane Iva- significant other Patient Special Instructions -----Shower with antibacterial soap. Pre-Op special Instructions -----  Patient verbalized understanding of instructions that were given at this phone interview. Patient denies chest pain, sob, fever, cough at the interview.

## 2024-04-03 ENCOUNTER — Encounter (HOSPITAL_COMMUNITY): Payer: Self-pay | Admitting: Urology

## 2024-04-03 ENCOUNTER — Ambulatory Visit (HOSPITAL_COMMUNITY)
Admission: RE | Admit: 2024-04-03 | Discharge: 2024-04-03 | Disposition: A | Discharge status: Home / Self Care | Attending: Urology | Admitting: Urology

## 2024-04-03 ENCOUNTER — Ambulatory Visit (HOSPITAL_COMMUNITY)

## 2024-04-03 ENCOUNTER — Encounter (HOSPITAL_COMMUNITY)
Admission: RE | Disposition: A | Discharge status: Home / Self Care | Payer: Self-pay | Source: Home / Self Care | Attending: Urology

## 2024-04-03 DIAGNOSIS — M1990 Unspecified osteoarthritis, unspecified site: Secondary | ICD-10-CM | POA: Diagnosis not present

## 2024-04-03 DIAGNOSIS — Z8546 Personal history of malignant neoplasm of prostate: Secondary | ICD-10-CM | POA: Diagnosis not present

## 2024-04-03 DIAGNOSIS — N202 Calculus of kidney with calculus of ureter: Secondary | ICD-10-CM | POA: Diagnosis not present

## 2024-04-03 DIAGNOSIS — N132 Hydronephrosis with renal and ureteral calculous obstruction: Secondary | ICD-10-CM | POA: Diagnosis present

## 2024-04-03 DIAGNOSIS — F17210 Nicotine dependence, cigarettes, uncomplicated: Secondary | ICD-10-CM | POA: Insufficient documentation

## 2024-04-03 DIAGNOSIS — R9721 Rising PSA following treatment for malignant neoplasm of prostate: Secondary | ICD-10-CM | POA: Diagnosis not present

## 2024-04-03 HISTORY — PX: CYSTOSCOPY/URETEROSCOPY/HOLMIUM LASER/STENT PLACEMENT: SHX6546

## 2024-04-03 SURGERY — CYSTOSCOPY/URETEROSCOPY/HOLMIUM LASER/STENT PLACEMENT
Anesthesia: General | Site: Ureter | Laterality: Left

## 2024-04-03 MED ORDER — ACETAMINOPHEN 10 MG/ML IV SOLN: 1000.0000 mg | Freq: Once | INTRAVENOUS | Status: DC | PRN

## 2024-04-03 MED ORDER — EPHEDRINE SULFATE-NACL 50-0.9 MG/10ML-% IV SOSY
PREFILLED_SYRINGE | INTRAVENOUS | Status: DC | PRN
  Administered 2024-04-03: 5 mg via INTRAVENOUS
  Administered 2024-04-03: 10 mg via INTRAVENOUS

## 2024-04-03 MED ORDER — SODIUM CHLORIDE 0.9 % IR SOLN
Status: DC | PRN
  Administered 2024-04-03: 3000 mL

## 2024-04-03 MED ORDER — ONDANSETRON HCL 4 MG/2ML IJ SOLN
INTRAMUSCULAR | Status: DC | PRN
  Administered 2024-04-03: 4 mg via INTRAVENOUS

## 2024-04-03 MED ORDER — LACTATED RINGERS IV SOLN: INTRAVENOUS | Status: DC

## 2024-04-03 MED ORDER — PHENAZOPYRIDINE HCL 200 MG PO TABS: 200.0000 mg | ORAL_TABLET | Freq: Three times a day (TID) | ORAL | 0 refills | Status: AC | PRN

## 2024-04-03 MED ORDER — LIDOCAINE 2% (20 MG/ML) 5 ML SYRINGE
INTRAMUSCULAR | Status: DC | PRN
  Administered 2024-04-03: 60 mg via INTRAVENOUS

## 2024-04-03 MED ORDER — IOHEXOL 300 MG/ML  SOLN
INTRAMUSCULAR | Status: DC | PRN
  Administered 2024-04-03: 10 mL via URETHRAL

## 2024-04-03 MED ORDER — GLYCOPYRROLATE 0.2 MG/ML IJ SOLN
INTRAMUSCULAR | Status: DC | PRN
Start: 2024-04-03 — End: 2024-04-03
  Administered 2024-04-03: .2 mg via INTRAVENOUS

## 2024-04-03 MED ORDER — OXYBUTYNIN CHLORIDE 5 MG PO TABS: 5.0000 mg | ORAL_TABLET | Freq: Three times a day (TID) | ORAL | 1 refills | Status: AC | PRN

## 2024-04-03 MED ORDER — FENTANYL CITRATE (PF) 100 MCG/2ML IJ SOLN: 25.0000 ug | INTRAMUSCULAR | Status: DC | PRN

## 2024-04-03 MED ORDER — FENTANYL CITRATE (PF) 250 MCG/5ML IJ SOLN
INTRAMUSCULAR | Status: AC
  Filled 2024-04-03: qty 5

## 2024-04-03 MED ORDER — CHLORHEXIDINE GLUCONATE 0.12 % MT SOLN
OROMUCOSAL | Status: AC
  Filled 2024-04-03: qty 15

## 2024-04-03 MED ORDER — PROPOFOL 10 MG/ML IV BOLUS
INTRAVENOUS | Status: DC | PRN
  Administered 2024-04-03: 140 mg via INTRAVENOUS

## 2024-04-03 MED ORDER — DROPERIDOL 2.5 MG/ML IJ SOLN: 0.6250 mg | Freq: Once | INTRAMUSCULAR | Status: DC | PRN

## 2024-04-03 MED ORDER — CHLORHEXIDINE GLUCONATE 0.12 % MT SOLN
15.0000 mL | Freq: Once | OROMUCOSAL | Status: AC
  Administered 2024-04-03: 15 mL via OROMUCOSAL

## 2024-04-03 MED ORDER — DEXAMETHASONE SODIUM PHOSPHATE 10 MG/ML IJ SOLN
INTRAMUSCULAR | Status: DC | PRN
  Administered 2024-04-03: 10 mg via INTRAVENOUS

## 2024-04-03 MED ORDER — MIDAZOLAM HCL 2 MG/2ML IJ SOLN
INTRAMUSCULAR | Status: DC | PRN
  Administered 2024-04-03: 2 mg via INTRAVENOUS

## 2024-04-03 MED ORDER — ORAL CARE MOUTH RINSE: 15.0000 mL | Freq: Once | OROMUCOSAL | Status: AC

## 2024-04-03 MED ORDER — TRAMADOL HCL 50 MG PO TABS: 50.0000 mg | ORAL_TABLET | Freq: Four times a day (QID) | ORAL | 0 refills | Status: AC | PRN

## 2024-04-03 MED ORDER — PHENYLEPHRINE HCL-NACL 20-0.9 MG/250ML-% IV SOLN
INTRAVENOUS | Status: DC | PRN
  Administered 2024-04-03: 25 ug/min via INTRAVENOUS

## 2024-04-03 MED ORDER — FENTANYL CITRATE (PF) 250 MCG/5ML IJ SOLN
INTRAMUSCULAR | Status: DC | PRN
  Administered 2024-04-03 (×2): 50 ug via INTRAVENOUS

## 2024-04-03 MED ORDER — CEFAZOLIN SODIUM-DEXTROSE 2-4 GM/100ML-% IV SOLN
INTRAVENOUS | Status: AC
  Filled 2024-04-03: qty 100

## 2024-04-03 MED ORDER — CEFAZOLIN SODIUM-DEXTROSE 2-4 GM/100ML-% IV SOLN
2.0000 g | INTRAVENOUS | Status: AC
  Administered 2024-04-03: 2 g via INTRAVENOUS

## 2024-04-03 MED ORDER — CEPHALEXIN 500 MG PO CAPS: 500.0000 mg | ORAL_CAPSULE | Freq: Two times a day (BID) | ORAL | 0 refills | Status: AC

## 2024-04-03 SURGICAL SUPPLY — 27 items
BAG DRAIN URO-CYSTO SKYTR STRL (DRAIN) ×1 IMPLANT
BASKET ZERO TIP NITINOL 2.4FR (BASKET) ×1 IMPLANT
BENZOIN TINCTURE PRP APPL 2/3 (GAUZE/BANDAGES/DRESSINGS) IMPLANT
CATH URETERAL DUAL LUMEN 10F (MISCELLANEOUS) IMPLANT
CATH URETL OPEN 5X70 (CATHETERS) IMPLANT
EXTRACTOR STONE 1.7FRX115CM (UROLOGICAL SUPPLIES) IMPLANT
FIBER LASER FLEXIVA 200 (UROLOGICAL SUPPLIES) IMPLANT
GLOVE BIO SURGEON STRL SZ7.5 (GLOVE) ×1 IMPLANT
GOWN STRL REUS W/ TWL XL LVL3 (GOWN DISPOSABLE) ×1 IMPLANT
GUIDEWIRE STR DUAL SENSOR (WIRE) IMPLANT
GUIDEWIRE ZIPWRE .038 STRAIGHT (WIRE) ×1 IMPLANT
KIT TURNOVER KIT B (KITS) ×1 IMPLANT
LASER FIB FLEXIVA PULSE ID 365 (Laser) IMPLANT
MANIFOLD NEPTUNE II (INSTRUMENTS) ×1 IMPLANT
NS IRRIG 1000ML POUR BTL (IV SOLUTION) IMPLANT
NS IRRIG 500ML POUR BTL (IV SOLUTION) ×1 IMPLANT
PACK CYSTO (CUSTOM PROCEDURE TRAY) ×1 IMPLANT
SHEATH NAVIGATOR HD 11/13X28 (SHEATH) IMPLANT
SHEATH NAVIGATOR HD 11/13X36 (SHEATH) IMPLANT
SHEATH NAVIGATOR HD 12/14X46 (SHEATH) IMPLANT
SLEEVE SCD COMPRESS KNEE MED (STOCKING) ×1 IMPLANT
SOL .9 NS 3000ML IRR UROMATIC (IV SOLUTION) IMPLANT
STENT URET 6FRX24 CONTOUR (STENTS) IMPLANT
STRIP CLOSURE SKIN 1/2X4 (GAUZE/BANDAGES/DRESSINGS) IMPLANT
SYR 10ML LL (SYRINGE) ×1 IMPLANT
TUBE CONNECTING 12X1/4 (SUCTIONS) ×1 IMPLANT
TUBING UROLOGY SET (TUBING) ×1 IMPLANT

## 2024-04-03 NOTE — Anesthesia Procedure Notes (Signed)
 Procedure Name: LMA Insertion Date/Time: 04/03/2024 1:16 PM  Performed by: Elby Raelene SAUNDERS, CRNAPre-anesthesia Checklist: Patient identified, Emergency Drugs available, Suction available and Patient being monitored Patient Re-evaluated:Patient Re-evaluated prior to induction Oxygen Delivery Method: Circle System Utilized Preoxygenation: Pre-oxygenation with 100% oxygen Induction Type: IV induction Ventilation: Mask ventilation without difficulty LMA: LMA inserted LMA Size: 4.0 Number of attempts: 1 Placement Confirmation: positive ETCO2 Tube secured with: Tape Dental Injury: Teeth and Oropharynx as per pre-operative assessment

## 2024-04-03 NOTE — Discharge Instructions (Signed)

## 2024-04-03 NOTE — Transfer of Care (Signed)
 Immediate Anesthesia Transfer of Care Note  Patient: Peter Arnold  Procedure(s) Performed: CYSTOSCOPY/URETEROSCOPY/HOLMIUM LASER/STENT PLACEMENT WITH RETROGARDE PYELOGRAM (Left: Ureter)  Patient Location: PACU  Anesthesia Type:General  Level of Consciousness: awake  Airway & Oxygen Therapy: Patient Spontanous Breathing  Post-op Assessment: Report given to RN and Post -op Vital signs reviewed and stable  Post vital signs: Reviewed and stable  Last Vitals:  Vitals Value Taken Time  BP 156/85 04/03/24 14:37  Temp    Pulse 68 04/03/24 14:38  Resp 10 04/03/24 14:38  SpO2 98 % 04/03/24 14:38  Vitals shown include unfiled device data.  Last Pain:  Vitals:   04/03/24 1107  TempSrc: Oral  PainSc: 0-No pain      Patients Stated Pain Goal: 4 (04/03/24 1107)  Complications: No notable events documented.

## 2024-04-03 NOTE — Anesthesia Preprocedure Evaluation (Signed)
 Anesthesia Evaluation  Patient identified by MRN, date of birth, ID band Patient awake    Reviewed: Allergy & Precautions, NPO status , Patient's Chart, lab work & pertinent test results  Airway Mallampati: II  TM Distance: >3 FB Neck ROM: Full    Dental no notable dental hx.    Pulmonary neg pulmonary ROS, Current Smoker   Pulmonary exam normal        Cardiovascular negative cardio ROS  Rhythm:Regular Rate:Normal     Neuro/Psych negative neurological ROS  negative psych ROS   GI/Hepatic negative GI ROS, Neg liver ROS,,,  Endo/Other  negative endocrine ROS    Renal/GU Renal calculi   negative genitourinary   Musculoskeletal  (+) Arthritis , Osteoarthritis,    Abdominal Normal abdominal exam  (+)   Peds  Hematology negative hematology ROS (+)   Anesthesia Other Findings   Reproductive/Obstetrics                              Anesthesia Physical Anesthesia Plan  ASA: 2  Anesthesia Plan: General   Post-op Pain Management:    Induction: Intravenous  PONV Risk Score and Plan: 1 and Ondansetron , Dexamethasone  and Treatment may vary due to age or medical condition  Airway Management Planned: Mask and LMA  Additional Equipment: None  Intra-op Plan:   Post-operative Plan: Extubation in OR  Informed Consent: I have reviewed the patients History and Physical, chart, labs and discussed the procedure including the risks, benefits and alternatives for the proposed anesthesia with the patient or authorized representative who has indicated his/her understanding and acceptance.     Dental advisory given  Plan Discussed with: CRNA  Anesthesia Plan Comments:         Anesthesia Quick Evaluation

## 2024-04-03 NOTE — Progress Notes (Signed)
 PACU Nursing Note: arrived into Phase I PACU from urological surgery, after 5 min in PACU, opening eyes, talkative, mae x 4, had urgency, urinal provided, immediately voided into urinal without issue, and voices no complaints of pain or discomfort, taking in POs well, VS noted, SBP > in rt arm, cuffed placed on left arm for verification, cont to have elevated SBP, GCS 15, no neuro complaints, assisted to getting dressed with min assistance. IV removed. Placed in wc.

## 2024-04-03 NOTE — Op Note (Signed)
 Operative Note  Preoperative diagnosis:  1.  Multiple left distal ureteral stones measuring approximately 6 mm each 2.  A total of two, 1 cm nonobstructing left renal stones  Postoperative diagnosis: 1.  Same  Procedure(s): 1.  Cystoscopy with left ureteroscopy, holmium laser lithotripsy and left JJ stent placement 2.  Left retrograde pyelogram with intraoperative interpretation of fluoroscopic imaging  Surgeon: Lonni Han, MD  Assistants:  None  Anesthesia:  General  Complications:  None  EBL: Less than 5 mL  Specimens: 1.  Left ureteral stone fragments  Drains/Catheters: 1.  Left 6 French, 24 cm JJ stent without tether  Intraoperative findings:   No intravesical or urethral abnormalities were seen Left retrograde pyelogram revealed a filling defect within the distal aspects of the left ureter, consistent with the obstructing stone seen on recent CT.  There are no filling defects within the remaining proximal aspects of the left ureter or within the renal pelvis  Indication:  Peter Arnold is a 78 y.o. male with obstructing left ureteral stones as well as nonobstructing left renal stones.  He has been consented for the above procedures, voices understanding and wishes to proceed.  Description of procedure:  After informed consent was obtained, the patient was brought to the operating room and general LMA anesthesia was administered. The patient was then placed in the dorsolithotomy position and prepped and draped in the usual sterile fashion. A timeout was performed. A 21 French rigid cystoscope was then inserted into the urethral meatus and advanced into the bladder under direct vision. A complete bladder survey revealed no intravesical pathology.  A 5 French ureteral catheter was then inserted into the left ureteral orifice and a retrograde pyelogram was obtained, with the findings listed above.  A Glidewire was then used to intubate the lumen of the ureteral  catheter and was advanced up to the left renal pelvis, under fluoroscopic guidance.  The catheter was then removed, leaving the wire in place.  A semirigid ureteroscope was then advanced up the left ureter where multiple 6 mm stones were identified.  A 200 m holmium laser was then used to fracture the stone into numerous smaller pieces.  A 0 tip basket was then used to extract all stone fragments from the lumen of the left ureter.  An additional sensor wire was then advanced up to the left renal pelvis, and fluoroscopic guidance.  A 35 cm, 12/14 French ureteral access sheath was then advanced over the wire and into good position within the proximal aspects of the left ureter, confirming placement via fluoroscopy.  A flexible ureteroscope was then advanced through the lumen of the access sheath and up to the left renal pelvis where multiple stones were identified.  A 200 m holmium laser was then used to dust the stone into less than 2 mm fragments.  The flexible ureteroscope and ureteral access sheath were then removed under direct vision, identifying no evidence of ureteral trauma or luminal stone burden.  The rigid cystoscope was then reinserted into the bladder over the Glidewire.  A 6 French, 24 cm JJ stent was then advanced over the Glidewire and into good position within the left collecting system, confirming placement via fluoroscopy.  The patient's bladder was drained and all stone fragments were evacuated.  He tolerated the procedure well and was transferred to the postanesthesia in stable condition.  Plan: Follow-up in 1 week for office cystoscopy and stent removal

## 2024-04-03 NOTE — H&P (Signed)
 Office Visit Report     03/19/2024   --------------------------------------------------------------------------------   Peter Arnold  MRN: [id number]  DOB: 14-Aug-1946, 78 year old Male  SSN:    PRIMARY CARE:  Elsie L. Carolee, MD  PRIMARY CARE FAX:  [phone]  REFERRING:  Lonni LABOR. Devere, MD  PROVIDER:  Lonni Devere, M.D.  LOCATION:  [facility] [phone]     --------------------------------------------------------------------------------   CC/HPI: Prostate cancer   Peter Arnold is a 78 year-old male with a history of grade 4 prostate cancer diagnosed by Dr. Sherrilee in 2018 (s/p IMRT and previously received ADT via Eligard). He also has a significant history of kidney stones and is status post bilateral ureteroscopy on 02/01/2021.   PSA at diagnosis: 242  Last PSA: 0.87 (01/2024), 0.48 (05/2023), 0.27 (11/2022), 0.16 (08/2022), 0.112 (04/2022), 0.03 (10/2021)  Last Eligard injection: 05/20/2020   02/14/2021: The patient is here today for bilateral stent removal following bilateral ureteroscopy with laser lithotripsy on 02/01/2021 to address multiple obstructing ureteral calculi. He has done well since surgery and denies interval flank pain, stone passage, dysuria or persistent hematuria.   04/28/21: The patient is here today for a routine follow-up and RUS. He is doing well and states that he has fully recovered from his recent surgery. He denies interval stone passage, flank pain, hematuria or dysuria. He continues to take tamsulosin once daily--he reports a good FOS and feels like he is emptying his bladder well.   05/17/22: The patient is here today for a routine follow-up. His PSA continues to slowly rise. He has done well over the past year and denies interval episodes of flank pain, dysuria, hematuria or stone passage. No new health issues. He remains very active. He continues to take tamsulosin once daily and has minimal LUTS.   11/29/22: The patient is here  today for a routine follow-up. His PSA continues to slowly rise. He denies any new health issues or new urinary complaints. He denies interval UTIs, dysuria or hematuria.   07/04/23: The patient is here today for a routine follow-up. His PSA continues to rise. His recent PSMA PET CT showed no evidence of local recurrence or metastatic disease. From a GU standpoint, he reports no bothersome LUTS and denies interval UTI, dysuria or hematuria. He continues to take tamsulosin once daily and reports good force of stream and feels like he is emptying his bladder well. He is also continuing to use sildenafil as needed and reports good erectile quality with no associated side effects. No new health issues.   03/19/24: The patient is here today for an expedited follow-up. His PSA continues to rise, but his PSMA PET CT shows no signs of local recurrence or metastatic disease. He reports a 1-2 week history of intermittent episodes of sharp left flank pain associated with mild nausea. He states that he passed a small stone several weeks ago with no associated pain.   Interval:  CTSS from 03/25/24 shows obstructing left ureteral stones associated with hydronephrosis along with multiple left renal stones. He is here today for ureteroscopy to address his left sided stone burden.    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 - 07/04/2023 Prostate Cancer - 07/04/2023, - 11/29/2022, - 05/17/2022, - 2022, - 2022, - 2021, - 2021, - 2020, - 2020, - 2019, - 2019, - 2019, - 2019, -  2019 Rising PSA after prostate cancer treatment - 07/04/2023, - 05/17/2022 Renal calculus, 2-3 mm left renal stone - 05/17/2022 Renal cyst - 2022 Ureteral obstruction secondary to calculous - 2022, - 2022 Renal and ureteral calculus - 2022 Flank Pain - 2022, - 2022 Ureteral calculus - 2022 BPH w/LUTS - 2021, - 2021, - 2020, - 2020, - 2019, - 2019, - 2019, - 2019 Nocturia - 2021    NON-GU PMH: No Non-GU PMH    FAMILY HISTORY: Death In The Family Father - Mother Death In The Family Mother - Mother Esophageal Cancer - Father Prostate Cancer - Grandfather   SOCIAL HISTORY: Marital Status: Divorced Preferred Language: English; Ethnicity: Not Hispanic Or Latino; Race: White Current Smoking Status: Patient smokes. Has smoked since 09/11/1987. Smokes 1 pack per day.   Tobacco Use Assessment Completed: Used Tobacco in last 30 days? Has never drank.  Drinks 2 caffeinated drinks per day. 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    REVIEW OF SYSTEMS:    GU Review Male:   Patient denies frequent urination, hard to postpone urination, burning/ pain with urination, get up at night to urinate, leakage of urine, stream starts and stops, trouble starting your stream, have to strain to urinate , erection problems, and penile pain.  Gastrointestinal (Upper):   Patient denies nausea, vomiting, and indigestion/ heartburn.  Gastrointestinal (Lower):   Patient denies diarrhea and constipation.  Constitutional:   Patient denies fever, night sweats, weight loss, and fatigue.  Skin:   Patient denies skin rash/ lesion and itching.  Eyes:   Patient denies blurred vision and double vision.  Ears/ Nose/ Throat:   Patient denies sore throat and sinus problems.  Hematologic/Lymphatic:   Patient denies swollen glands and easy bruising.  Cardiovascular:   Patient denies leg swelling and chest pains.  Respiratory:   Patient denies cough and shortness of breath.  Endocrine:   Patient denies excessive thirst.   Musculoskeletal:   Patient denies back pain and joint pain.  Neurological:   Patient denies headaches and dizziness.  Psychologic:   Patient denies depression and anxiety.   Notes: Left flank pain     VITAL SIGNS:      03/19/2024 12:45 PM  BP 123/69 mmHg  Pulse 63 /min  Temperature 97.8 F / 36.5 C   MULTI-SYSTEM PHYSICAL EXAMINATION:    Constitutional: Well-nourished. No physical deformities. Normally developed. Good grooming. No acute distress  Neurologic / Psychiatric: Oriented to time, oriented to place, oriented to person. No depression, no anxiety, no agitation.     Complexity of Data:  Lab Test Review:   PSA  Records Review:   Previous Patient Records  X-Ray Review: PET- PSMA Scan: Reviewed Films. Reviewed Report. Discussed With Patient.   CLINICAL DATA: Flank pain, renal calculus. Microscopic hematuria.   EXAM:  CT ABDOMEN AND PELVIS WITHOUT CONTRAST   TECHNIQUE:  Multidetector CT imaging of the abdomen and pelvis was performed  following the standard protocol without IV contrast.   RADIATION DOSE REDUCTION: This exam was performed according to the  departmental dose-optimization program which includes automated  exposure control, adjustment of the mA and/or kV according to  patient  size and/or use of iterative reconstruction technique.   COMPARISON: CT October 07, 2017   FINDINGS:  Lower chest: Bibasilar atelectasis versus scarring.   Hepatobiliary: Unremarkable noncontrast enhanced appearance of the  hepatic parenchyma. Cholelithiasis without findings of acute  cholecystitis. No biliary ductal dilation.   Pancreas: No pancreatic ductal dilation or evidence of acute  inflammation.   Spleen: No splenomegaly or focal splenic lesion.   Adrenals/Urinary Tract: Bilateral adrenal glands appear normal.   Left hydroureteronephrosis to multiple stacked stones in the ureter  at the level of the pelvic inlet measuring up to 6 mm. Additional  nonobstructive  bilateral renal stones   Incompletely evaluated on this noncontrast exam in a shin is a  septated 5.2 cm left upper pole renal cyst. Additional hypodense  bilateral renal lesions measure fluid density compatible with cysts.   Urinary bladder is unremarkable for degree of distension.   Stomach/Bowel: Stomach is within normal limits. Appendix appears  normal. No evidence of bowel wall thickening, distention, or  inflammatory changes.   Vascular/Lymphatic: Aortic atherosclerosis. No enlarged abdominal or  pelvic lymph nodes.   Reproductive: Enlarged prostate gland.   Other: No significant abdominopelvic free fluid.   Musculoskeletal: Diffuse demineralization of bone. Multilevel  degenerative changes of the spine.   IMPRESSION:  1. Left hydroureteronephrosis to stacked stones in the ureter at the  level of the pelvic inlet measuring up to 6 mm.  2. Additional nonobstructive bilateral renal stones.  3. Septated 5.2 cm left upper pole renal cyst is incompletely  evaluated on this noncontrast enhanced examination. Consider more  definitive assessment by renal protocol abdominal MRI with and  without contrast.  4. Cholelithiasis without findings of acute cholecystitis.  5. Enlarged prostate gland.  6. Aortic atherosclerosis.   Aortic Atherosclerosis (ICD10-I70.0).    Electronically Signed  By: Reyes Holder M.D.  On: 03/25/2024 15:26      01/01/24 05/28/23 11/22/22 08/17/22 04/27/22 11/03/21 03/31/21 05/13/20  PSA  Total PSA 0.87 ng/mL 0.48 ng/mL 0.27 ng/mL 0.16 ng/mL 0.112 ng/mL 0.030 ng/mL <0.015 ng/mL <0.015 ng/mL    11/12/17  Hormones  Testosterone, Total <10 ng/dL    PROCEDURES:         KUB - 74018  A single view of the abdomen is obtained.      Patient confirmed No Neulasta OnPro Device.   Her multiple calcifications seen within the left renal shadow. There are no calcifications seen along the expected course of either ureter or within the bladder.           Renal Ultrasound - 23224  Right Kidney: Length: 11.4 cm Depth: 7.0 cm Cortical Width: 1.1 cm Width: 4.5 cm  Left Kidney: Length: 11.7 cm Depth: 6.5 cm Cortical Width: 1.5 cm Width: 5.6 cm  Left Kidney/Ureter:  Mild/Mod hydro. Calcification LP. Multiple Cysts-Largest UP  Right Kidney/Ureter:  Single calcification MP. Cyst UP  Bladder:  PVR 14.1 ml      Patient confirmed No Neulasta OnPro Device.   Mild to moderate left-sided hydronephrosis. Right kidney is WNL. No solid parenchymal lesions are seen, bilaterally. Bladder is decompressed with smooth contour.         Urinalysis w/Scope Dipstick Dipstick Cont'd Micro  Color: Yellow Bilirubin: Neg mg/dL WBC/hpf: NS (Not Seen)  Appearance: Clear Ketones: Neg mg/dL RBC/hpf: 0 - 2/hpf  Specific Gravity: 1.025 Blood: Trace ery/uL Bacteria: NS (Not Seen)  pH: 5.5 Protein: Neg mg/dL Cystals: Amorph Urates  Glucose: Neg mg/dL Urobilinogen: 1.0 mg/dL Casts: NS (Not Seen)  Nitrites: Neg Trichomonas: Not Present    Leukocyte Esterase: Neg leu/uL Mucous: Not Present      Epithelial Cells: 0 - 5/hpf      Yeast: NS (Not Seen)      Sperm: Not Present    ASSESSMENT:      ICD-10 Details  1 GU:   Prostate Cancer - C61 Chronic, Stable  2   Rising PSA after prostate cancer treatment - R97.21 Chronic, Stable  3   Renal calculus - N20.0 Left, Chronic, Stable  4   Flank Pain - R10.84 Left, Acute, Uncomplicated  5   Hydronephrosis - N13.0 Left, Undiagnosed New Problem   PLAN:           Orders X-Rays: C.T. Stone Protocol Without I.V. Contrast    Renal Ultrasound    KUB          Schedule Return Visit/Planned Activity: Keep Scheduled Appointment - F/U Pending Results          Document Letter(s):  Created for Patient: Clinical Summary         Notes:    -RUS today shows mild to moderate hydronephrosis. KUB shows evidence of left renal stones, but no definitive ureteral stone burden. CT stone study pending. We discussed criteria to return to  clinic or proceed to the ER which include: Fever/chills, worsening pain, nausea/vomiting and/or persistent gross hematuria.    The risks, benefits and alternatives of cystoscopy with LEFT ureteroscopy, laser lithotripsy and ureteral stent placement was discussed the patient.  Risks included, but are not limited to: bleeding, urinary tract infection, ureteral injury/avulsion, ureteral stricture formation, retained stone fragments, the possibility that multiple surgeries may be required to treat the stone(s), MI, stroke, PE and the inherent risks of general anesthesia.  The patient voices understanding and wishes to proceed.

## 2024-04-04 NOTE — Anesthesia Postprocedure Evaluation (Signed)
 Anesthesia Post Note  Patient: Peter Arnold  Procedure(s) Performed: CYSTOSCOPY/URETEROSCOPY/HOLMIUM LASER/STENT PLACEMENT WITH RETROGARDE PYELOGRAM (Left: Ureter)     Patient location during evaluation: PACU Anesthesia Type: General Level of consciousness: awake and alert Pain management: pain level controlled Vital Signs Assessment: post-procedure vital signs reviewed and stable Respiratory status: spontaneous breathing, nonlabored ventilation, respiratory function stable and patient connected to nasal cannula oxygen Cardiovascular status: blood pressure returned to baseline and stable Postop Assessment: no apparent nausea or vomiting Anesthetic complications: no   No notable events documented.  Last Vitals:  Vitals:   04/03/24 1448 04/03/24 1458  BP: (!) 169/91 (!) 161/88  Pulse:    Resp:    Temp:    SpO2:      Last Pain:  Vitals:   04/03/24 1458  TempSrc:   PainSc: 0-No pain                 Thom JONELLE Peoples

## 2024-04-05 ENCOUNTER — Encounter (HOSPITAL_COMMUNITY): Payer: Self-pay | Admitting: Urology

## 2024-07-03 ENCOUNTER — Other Ambulatory Visit (HOSPITAL_COMMUNITY): Payer: Self-pay | Admitting: Urology

## 2024-07-03 DIAGNOSIS — R9721 Rising PSA following treatment for malignant neoplasm of prostate: Secondary | ICD-10-CM

## 2024-07-15 ENCOUNTER — Encounter (HOSPITAL_COMMUNITY)
Admission: RE | Admit: 2024-07-15 | Discharge: 2024-07-15 | Disposition: A | Discharge status: Home / Self Care | Source: Ambulatory Visit | Attending: Urology | Admitting: Urology

## 2024-07-15 DIAGNOSIS — R9721 Rising PSA following treatment for malignant neoplasm of prostate: Secondary | ICD-10-CM | POA: Insufficient documentation

## 2024-07-15 MED ORDER — FLOTUFOLASTAT F 18 GALLIUM 296-5846 MBQ/ML IV SOLN
8.0000 | Freq: Once | INTRAVENOUS | Status: DC
  Filled 2024-07-15: qty 8
